# Patient Record
Sex: Male | Born: 1960 | Race: White | Hispanic: No | Marital: Married | Smoking: Former smoker
Health system: Southern US, Community
[De-identification: ages and names within clinical notes are randomized; demographics above are authoritative.]

## PROBLEM LIST (undated history)

## (undated) DIAGNOSIS — F341 Dysthymic disorder: Secondary | ICD-10-CM

## (undated) DIAGNOSIS — I10 Essential (primary) hypertension: Secondary | ICD-10-CM

## (undated) DIAGNOSIS — I519 Heart disease, unspecified: Secondary | ICD-10-CM

## (undated) DIAGNOSIS — G4700 Insomnia, unspecified: Secondary | ICD-10-CM

## (undated) DIAGNOSIS — K648 Other hemorrhoids: Secondary | ICD-10-CM

## (undated) DIAGNOSIS — E7800 Pure hypercholesterolemia, unspecified: Secondary | ICD-10-CM

## (undated) DIAGNOSIS — F17290 Nicotine dependence, other tobacco product, uncomplicated: Secondary | ICD-10-CM

## (undated) DIAGNOSIS — N529 Male erectile dysfunction, unspecified: Secondary | ICD-10-CM

## (undated) DIAGNOSIS — J4520 Mild intermittent asthma, uncomplicated: Secondary | ICD-10-CM

## (undated) DIAGNOSIS — E559 Vitamin D deficiency, unspecified: Secondary | ICD-10-CM

## (undated) HISTORY — DX: Male erectile dysfunction, unspecified: N52.9

## (undated) HISTORY — PX: NO PAST SURGERIES: SHX2092

## (undated) HISTORY — DX: Essential (primary) hypertension: I10

## (undated) HISTORY — DX: Mild intermittent asthma, uncomplicated: J45.20

## (undated) HISTORY — DX: Dysthymic disorder: F34.1

## (undated) HISTORY — DX: Other hemorrhoids: K64.8

## (undated) HISTORY — DX: Heart disease, unspecified: I51.9

## (undated) HISTORY — DX: Insomnia, unspecified: G47.00

## (undated) HISTORY — PX: OTHER SURGICAL HISTORY: SHX169

## (undated) HISTORY — DX: Vitamin D deficiency, unspecified: E55.9

## (undated) HISTORY — DX: Pure hypercholesterolemia, unspecified: E78.00

## (undated) HISTORY — DX: Nicotine dependence, other tobacco product, uncomplicated: F17.290

---

## 2004-07-11 ENCOUNTER — Emergency Department (HOSPITAL_COMMUNITY): Admission: EM | Admit: 2004-07-11 | Discharge: 2004-07-11 | Payer: Self-pay | Admitting: Emergency Medicine

## 2006-08-21 IMAGING — CT CT PELVIS W/O CM
1 of 2 series · 16 of 32 positions shown, 20 images · non-contrast
Comparison: none

CLINICAL DATA: Lt sided abdominal pain.
 CT ABDOMEN WITHOUT CONTRAST, 07/11/04, 3533 HOURS:
 Mild left hydronephrosis and perinephric stranding is seen.  Negative nephrolithiasis.  No ureteral calculi are seen in the abdomen.  
 The visualized portions of the liver, spleen, adrenal glands, pancreas, gallbladder are within normal limits.  Negative free fluid.  Negative abnormal adenopathy.

[Series 3: stone_wo 5.0 b40f st · axial · 0.73mm/px · z∈[-255,+69]mm · 16 of 92 slices shown, 20 images]
[im 7/92  soft-tissue]
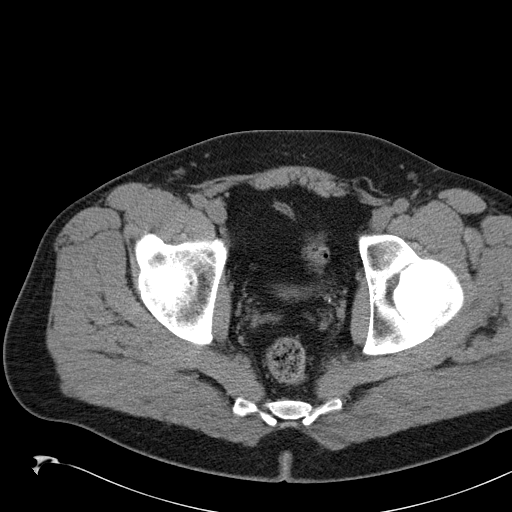
[im 7/92  bone]
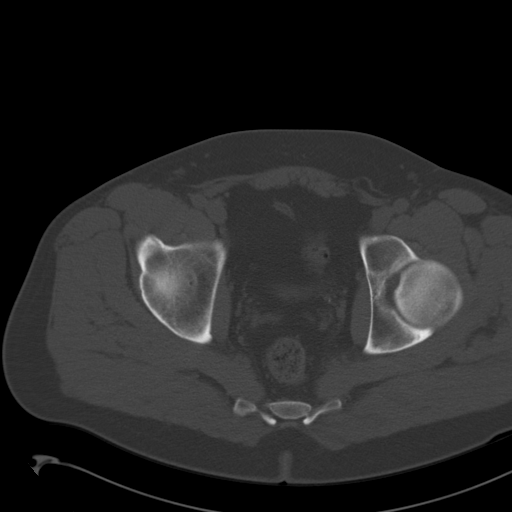
[im 13/92  soft-tissue]
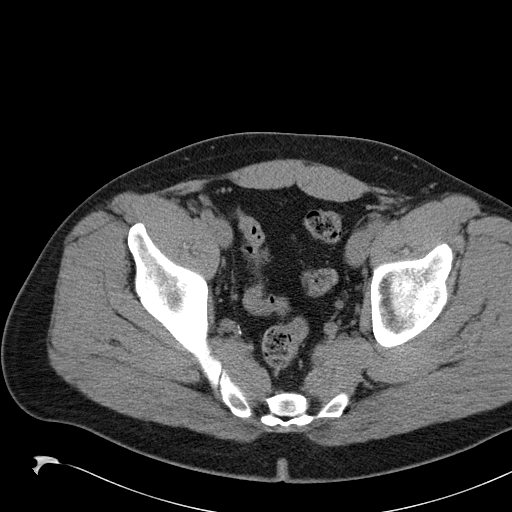
[im 19/92  soft-tissue]
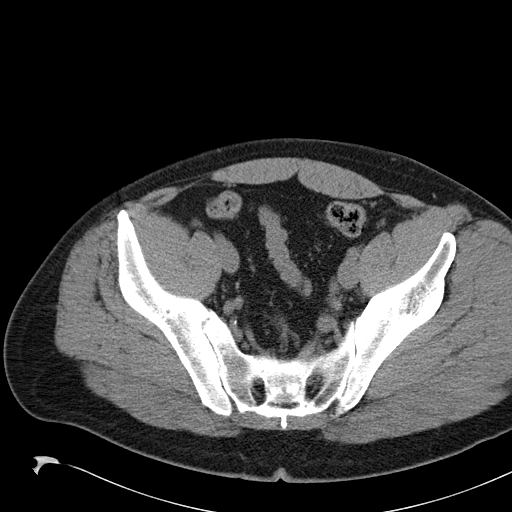
[im 25/92  soft-tissue]
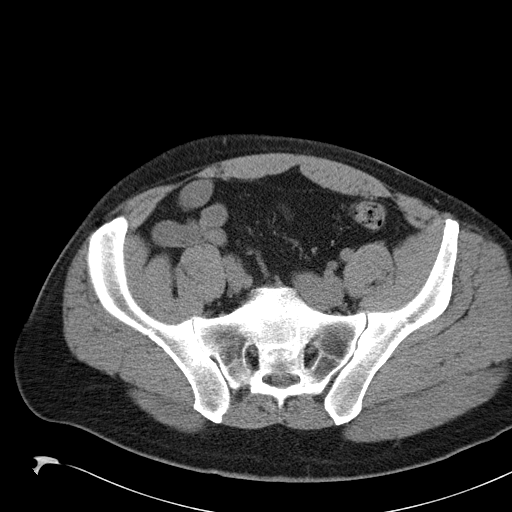
[im 31/92  soft-tissue]
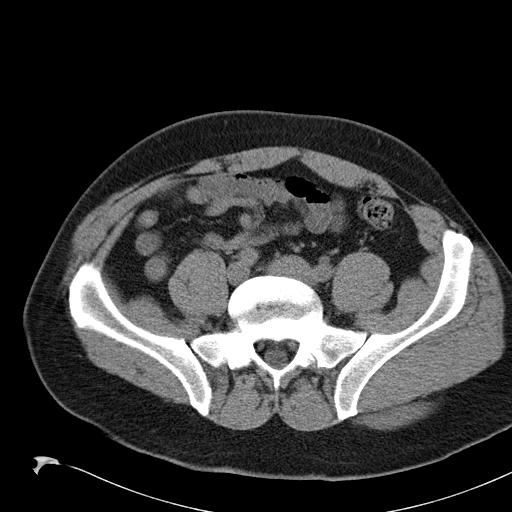
[im 37/92  soft-tissue]
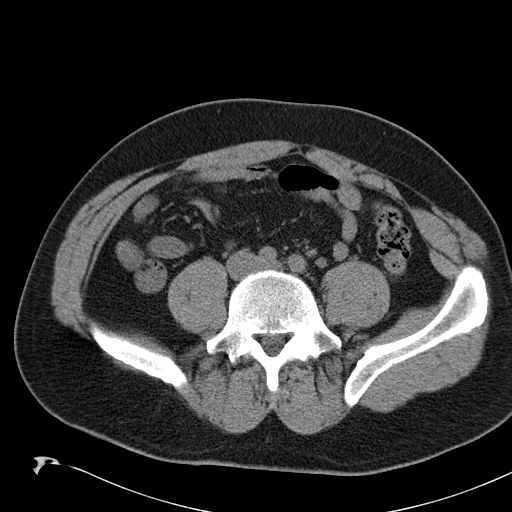
[im 43/92  soft-tissue]
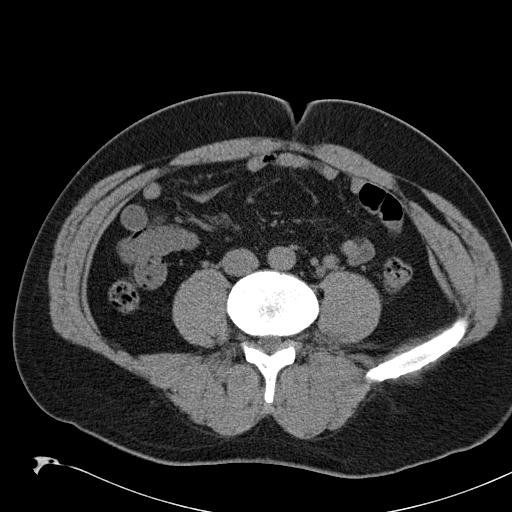
[im 49/92  soft-tissue]
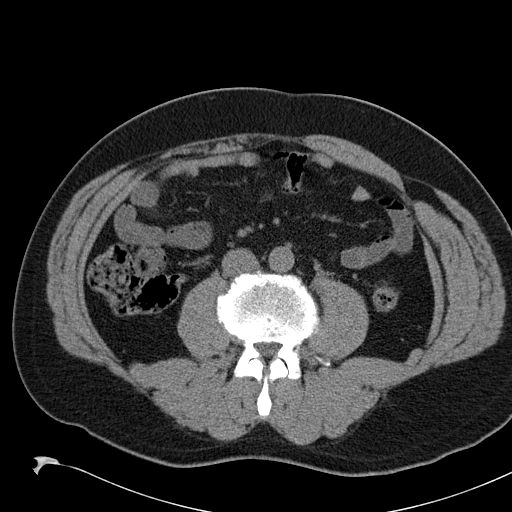
[im 55/92  soft-tissue]
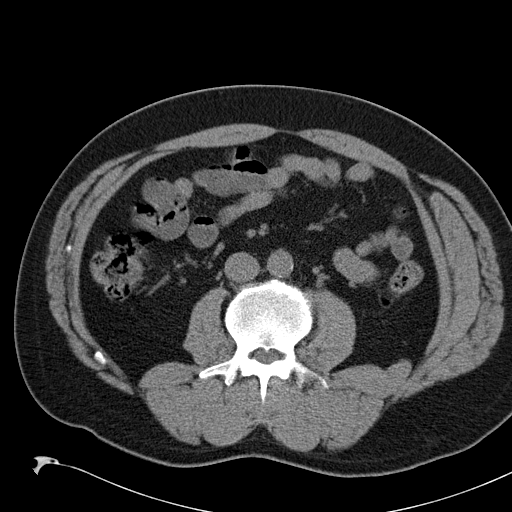
[im 55/92  bone]
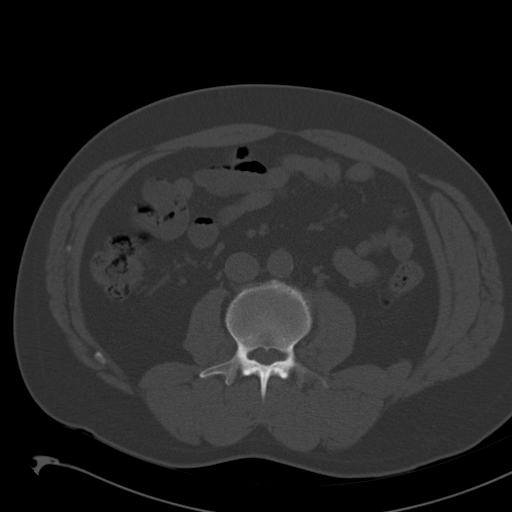
[im 61/92  soft-tissue]
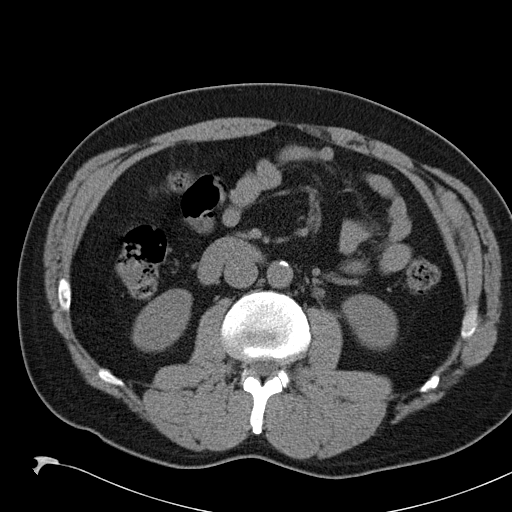
[im 67/92  soft-tissue]
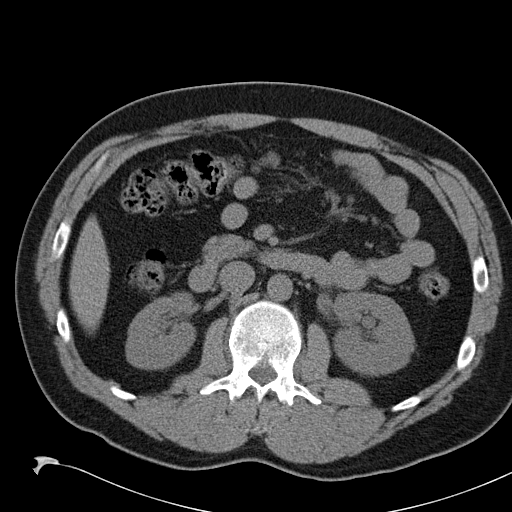
[im 73/92  soft-tissue]
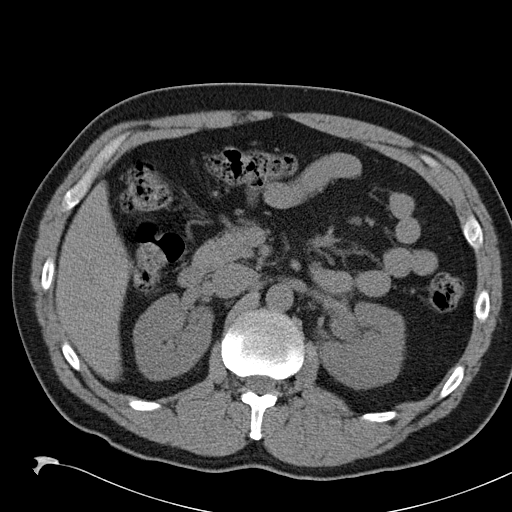
[im 79/92  soft-tissue]
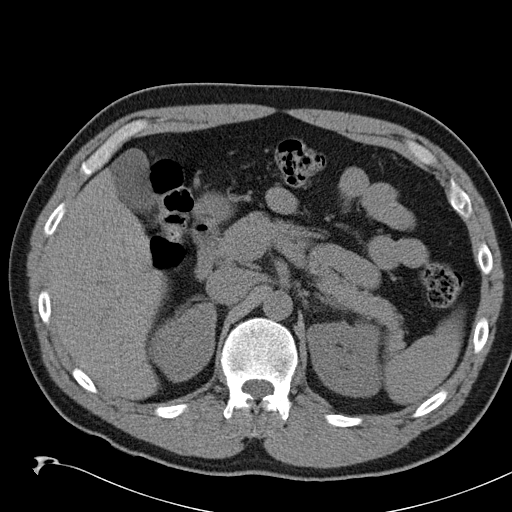
[im 79/92  lung]
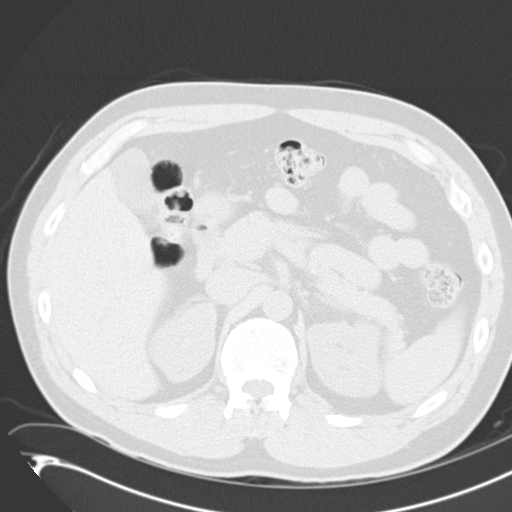
[im 82/92  lung]
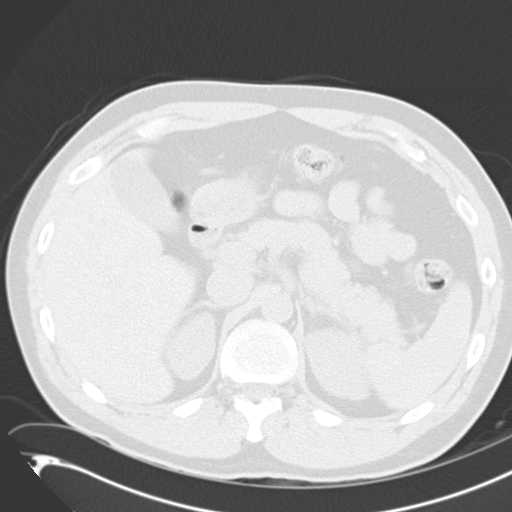
[im 85/92  soft-tissue]
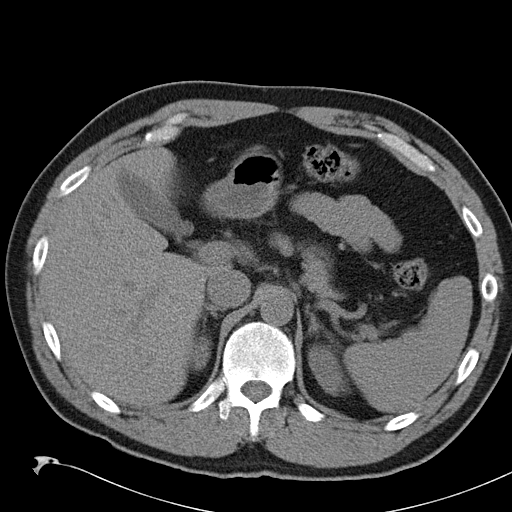
[im 85/92  lung]
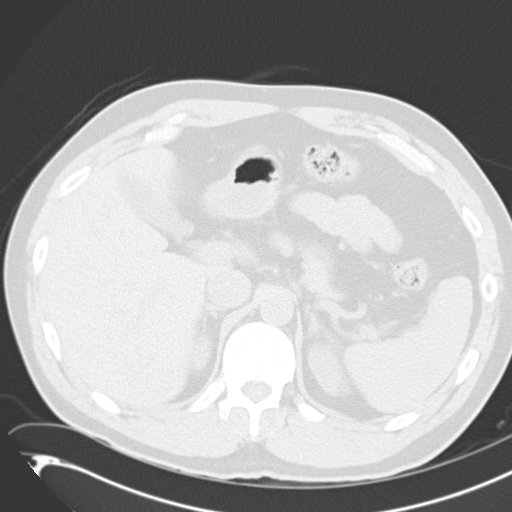
[im 88/92  lung]
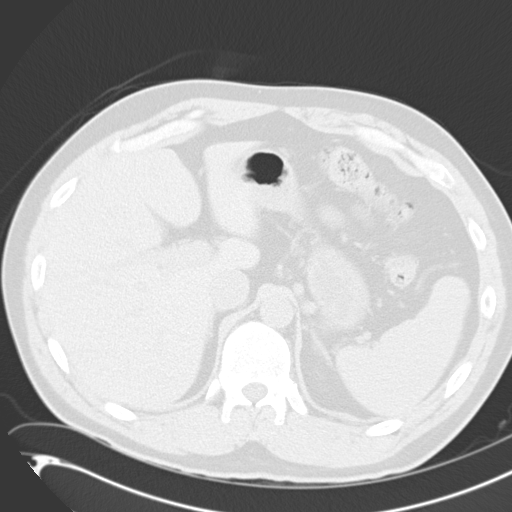

[16 of 32 positions shown; findings below may reference images not displayed]

IMPRESSION: Secondary findings of left ureteral obstruction as described above.
 CT OF THE PELVIS WITHOUT CONTRAST:
 There is a 2 mm calculus in the distal left ureter.  The bladder is decompressed.  Negative free fluid.  Negative abnormal adenopathy.
IMPRESSION: 2 mm left ureteral calculus.

## 2015-05-26 DIAGNOSIS — E559 Vitamin D deficiency, unspecified: Secondary | ICD-10-CM | POA: Diagnosis not present

## 2015-05-26 DIAGNOSIS — Z79899 Other long term (current) drug therapy: Secondary | ICD-10-CM | POA: Diagnosis not present

## 2015-05-26 DIAGNOSIS — E782 Mixed hyperlipidemia: Secondary | ICD-10-CM | POA: Diagnosis not present

## 2015-05-26 DIAGNOSIS — I10 Essential (primary) hypertension: Secondary | ICD-10-CM | POA: Diagnosis not present

## 2015-05-26 DIAGNOSIS — Z23 Encounter for immunization: Secondary | ICD-10-CM | POA: Diagnosis not present

## 2015-05-26 DIAGNOSIS — Z0001 Encounter for general adult medical examination with abnormal findings: Secondary | ICD-10-CM | POA: Diagnosis not present

## 2015-09-10 DIAGNOSIS — M542 Cervicalgia: Secondary | ICD-10-CM | POA: Diagnosis not present

## 2015-09-10 DIAGNOSIS — M546 Pain in thoracic spine: Secondary | ICD-10-CM | POA: Diagnosis not present

## 2015-09-10 DIAGNOSIS — W010XXA Fall on same level from slipping, tripping and stumbling without subsequent striking against object, initial encounter: Secondary | ICD-10-CM | POA: Diagnosis not present

## 2015-09-10 DIAGNOSIS — S299XXA Unspecified injury of thorax, initial encounter: Secondary | ICD-10-CM | POA: Diagnosis not present

## 2015-09-10 DIAGNOSIS — Y92512 Supermarket, store or market as the place of occurrence of the external cause: Secondary | ICD-10-CM | POA: Diagnosis not present

## 2015-09-22 DIAGNOSIS — M5021 Other cervical disc displacement,  high cervical region: Secondary | ICD-10-CM | POA: Diagnosis not present

## 2015-09-22 DIAGNOSIS — S13160A Subluxation of C5/C6 cervical vertebrae, initial encounter: Secondary | ICD-10-CM | POA: Diagnosis not present

## 2015-09-22 DIAGNOSIS — M542 Cervicalgia: Secondary | ICD-10-CM | POA: Diagnosis not present

## 2015-09-22 DIAGNOSIS — M4722 Other spondylosis with radiculopathy, cervical region: Secondary | ICD-10-CM | POA: Diagnosis not present

## 2015-09-28 DIAGNOSIS — M47812 Spondylosis without myelopathy or radiculopathy, cervical region: Secondary | ICD-10-CM | POA: Diagnosis not present

## 2015-09-28 DIAGNOSIS — G5622 Lesion of ulnar nerve, left upper limb: Secondary | ICD-10-CM | POA: Diagnosis not present

## 2015-09-28 DIAGNOSIS — G5620 Lesion of ulnar nerve, unspecified upper limb: Secondary | ICD-10-CM | POA: Diagnosis not present

## 2015-09-28 DIAGNOSIS — Z720 Tobacco use: Secondary | ICD-10-CM | POA: Diagnosis not present

## 2015-11-05 DIAGNOSIS — Z23 Encounter for immunization: Secondary | ICD-10-CM | POA: Diagnosis not present

## 2015-12-29 DIAGNOSIS — M4312 Spondylolisthesis, cervical region: Secondary | ICD-10-CM | POA: Diagnosis not present

## 2015-12-29 DIAGNOSIS — M4010 Other secondary kyphosis, site unspecified: Secondary | ICD-10-CM | POA: Diagnosis not present

## 2015-12-29 DIAGNOSIS — M50322 Other cervical disc degeneration at C5-C6 level: Secondary | ICD-10-CM | POA: Diagnosis not present

## 2015-12-29 DIAGNOSIS — R200 Anesthesia of skin: Secondary | ICD-10-CM | POA: Diagnosis not present

## 2016-01-03 DIAGNOSIS — G5622 Lesion of ulnar nerve, left upper limb: Secondary | ICD-10-CM | POA: Diagnosis not present

## 2016-01-03 DIAGNOSIS — M25522 Pain in left elbow: Secondary | ICD-10-CM | POA: Diagnosis not present

## 2016-01-19 DIAGNOSIS — M25522 Pain in left elbow: Secondary | ICD-10-CM | POA: Diagnosis not present

## 2016-01-19 DIAGNOSIS — R200 Anesthesia of skin: Secondary | ICD-10-CM | POA: Diagnosis not present

## 2016-01-21 DIAGNOSIS — M25522 Pain in left elbow: Secondary | ICD-10-CM | POA: Diagnosis not present

## 2016-01-27 DIAGNOSIS — M25522 Pain in left elbow: Secondary | ICD-10-CM | POA: Diagnosis not present

## 2016-01-27 DIAGNOSIS — S59902A Unspecified injury of left elbow, initial encounter: Secondary | ICD-10-CM | POA: Diagnosis not present

## 2016-02-04 DIAGNOSIS — M25522 Pain in left elbow: Secondary | ICD-10-CM | POA: Diagnosis not present

## 2016-02-24 DIAGNOSIS — M7712 Lateral epicondylitis, left elbow: Secondary | ICD-10-CM | POA: Diagnosis not present

## 2016-02-24 DIAGNOSIS — M67822 Other specified disorders of synovium, left elbow: Secondary | ICD-10-CM | POA: Diagnosis not present

## 2016-02-24 DIAGNOSIS — G5622 Lesion of ulnar nerve, left upper limb: Secondary | ICD-10-CM | POA: Diagnosis not present

## 2016-02-24 DIAGNOSIS — G8918 Other acute postprocedural pain: Secondary | ICD-10-CM | POA: Diagnosis not present

## 2016-02-24 DIAGNOSIS — I10 Essential (primary) hypertension: Secondary | ICD-10-CM | POA: Diagnosis not present

## 2016-02-24 DIAGNOSIS — E7800 Pure hypercholesterolemia, unspecified: Secondary | ICD-10-CM | POA: Diagnosis not present

## 2016-02-24 DIAGNOSIS — Z79899 Other long term (current) drug therapy: Secondary | ICD-10-CM | POA: Diagnosis not present

## 2016-02-24 DIAGNOSIS — E669 Obesity, unspecified: Secondary | ICD-10-CM | POA: Diagnosis not present

## 2016-02-24 DIAGNOSIS — Z6831 Body mass index (BMI) 31.0-31.9, adult: Secondary | ICD-10-CM | POA: Diagnosis not present

## 2016-03-09 DIAGNOSIS — R29898 Other symptoms and signs involving the musculoskeletal system: Secondary | ICD-10-CM | POA: Diagnosis not present

## 2016-03-09 DIAGNOSIS — M25522 Pain in left elbow: Secondary | ICD-10-CM | POA: Diagnosis not present

## 2016-03-09 DIAGNOSIS — M25622 Stiffness of left elbow, not elsewhere classified: Secondary | ICD-10-CM | POA: Diagnosis not present

## 2016-03-16 DIAGNOSIS — R29898 Other symptoms and signs involving the musculoskeletal system: Secondary | ICD-10-CM | POA: Diagnosis not present

## 2016-03-16 DIAGNOSIS — M25622 Stiffness of left elbow, not elsewhere classified: Secondary | ICD-10-CM | POA: Diagnosis not present

## 2016-03-16 DIAGNOSIS — M25522 Pain in left elbow: Secondary | ICD-10-CM | POA: Diagnosis not present

## 2016-03-17 DIAGNOSIS — M25522 Pain in left elbow: Secondary | ICD-10-CM | POA: Diagnosis not present

## 2016-03-23 DIAGNOSIS — R29898 Other symptoms and signs involving the musculoskeletal system: Secondary | ICD-10-CM | POA: Diagnosis not present

## 2016-03-23 DIAGNOSIS — M25522 Pain in left elbow: Secondary | ICD-10-CM | POA: Diagnosis not present

## 2016-03-23 DIAGNOSIS — M25622 Stiffness of left elbow, not elsewhere classified: Secondary | ICD-10-CM | POA: Diagnosis not present

## 2016-03-30 DIAGNOSIS — M25522 Pain in left elbow: Secondary | ICD-10-CM | POA: Diagnosis not present

## 2016-03-30 DIAGNOSIS — R29898 Other symptoms and signs involving the musculoskeletal system: Secondary | ICD-10-CM | POA: Diagnosis not present

## 2016-03-30 DIAGNOSIS — M25622 Stiffness of left elbow, not elsewhere classified: Secondary | ICD-10-CM | POA: Diagnosis not present

## 2016-04-14 DIAGNOSIS — M25522 Pain in left elbow: Secondary | ICD-10-CM | POA: Diagnosis not present

## 2016-04-14 DIAGNOSIS — M25622 Stiffness of left elbow, not elsewhere classified: Secondary | ICD-10-CM | POA: Diagnosis not present

## 2016-04-14 DIAGNOSIS — R29898 Other symptoms and signs involving the musculoskeletal system: Secondary | ICD-10-CM | POA: Diagnosis not present

## 2016-04-20 DIAGNOSIS — M25522 Pain in left elbow: Secondary | ICD-10-CM | POA: Diagnosis not present

## 2016-04-20 DIAGNOSIS — M25622 Stiffness of left elbow, not elsewhere classified: Secondary | ICD-10-CM | POA: Diagnosis not present

## 2016-04-20 DIAGNOSIS — R29898 Other symptoms and signs involving the musculoskeletal system: Secondary | ICD-10-CM | POA: Diagnosis not present

## 2016-05-03 DIAGNOSIS — R29898 Other symptoms and signs involving the musculoskeletal system: Secondary | ICD-10-CM | POA: Diagnosis not present

## 2016-05-03 DIAGNOSIS — M25622 Stiffness of left elbow, not elsewhere classified: Secondary | ICD-10-CM | POA: Diagnosis not present

## 2016-05-03 DIAGNOSIS — M25522 Pain in left elbow: Secondary | ICD-10-CM | POA: Diagnosis not present

## 2016-05-18 DIAGNOSIS — R29898 Other symptoms and signs involving the musculoskeletal system: Secondary | ICD-10-CM | POA: Diagnosis not present

## 2016-05-18 DIAGNOSIS — M25622 Stiffness of left elbow, not elsewhere classified: Secondary | ICD-10-CM | POA: Diagnosis not present

## 2016-05-18 DIAGNOSIS — M25522 Pain in left elbow: Secondary | ICD-10-CM | POA: Diagnosis not present

## 2016-06-02 DIAGNOSIS — M25522 Pain in left elbow: Secondary | ICD-10-CM | POA: Diagnosis not present

## 2016-06-02 DIAGNOSIS — M25622 Stiffness of left elbow, not elsewhere classified: Secondary | ICD-10-CM | POA: Diagnosis not present

## 2016-06-02 DIAGNOSIS — R29898 Other symptoms and signs involving the musculoskeletal system: Secondary | ICD-10-CM | POA: Diagnosis not present

## 2016-06-23 DIAGNOSIS — Z79899 Other long term (current) drug therapy: Secondary | ICD-10-CM | POA: Diagnosis not present

## 2016-06-23 DIAGNOSIS — I10 Essential (primary) hypertension: Secondary | ICD-10-CM | POA: Diagnosis not present

## 2016-06-23 DIAGNOSIS — E559 Vitamin D deficiency, unspecified: Secondary | ICD-10-CM | POA: Diagnosis not present

## 2016-06-23 DIAGNOSIS — Z125 Encounter for screening for malignant neoplasm of prostate: Secondary | ICD-10-CM | POA: Diagnosis not present

## 2016-06-23 DIAGNOSIS — E782 Mixed hyperlipidemia: Secondary | ICD-10-CM | POA: Diagnosis not present

## 2016-06-23 DIAGNOSIS — F341 Dysthymic disorder: Secondary | ICD-10-CM | POA: Diagnosis not present

## 2016-06-23 DIAGNOSIS — Z0001 Encounter for general adult medical examination with abnormal findings: Secondary | ICD-10-CM | POA: Diagnosis not present

## 2016-07-03 DIAGNOSIS — Z4789 Encounter for other orthopedic aftercare: Secondary | ICD-10-CM | POA: Diagnosis not present

## 2016-07-03 DIAGNOSIS — M25522 Pain in left elbow: Secondary | ICD-10-CM | POA: Diagnosis not present

## 2016-10-02 DIAGNOSIS — M25522 Pain in left elbow: Secondary | ICD-10-CM | POA: Diagnosis not present

## 2017-01-12 DIAGNOSIS — Z23 Encounter for immunization: Secondary | ICD-10-CM | POA: Diagnosis not present

## 2017-01-23 DIAGNOSIS — S91312A Laceration without foreign body, left foot, initial encounter: Secondary | ICD-10-CM | POA: Diagnosis not present

## 2017-02-26 DIAGNOSIS — M25522 Pain in left elbow: Secondary | ICD-10-CM | POA: Diagnosis not present

## 2017-03-29 DIAGNOSIS — M542 Cervicalgia: Secondary | ICD-10-CM | POA: Diagnosis not present

## 2017-03-29 DIAGNOSIS — G5622 Lesion of ulnar nerve, left upper limb: Secondary | ICD-10-CM | POA: Diagnosis not present

## 2017-04-04 DIAGNOSIS — M25522 Pain in left elbow: Secondary | ICD-10-CM | POA: Diagnosis not present

## 2017-07-04 DIAGNOSIS — Z125 Encounter for screening for malignant neoplasm of prostate: Secondary | ICD-10-CM | POA: Diagnosis not present

## 2017-07-04 DIAGNOSIS — Z0001 Encounter for general adult medical examination with abnormal findings: Secondary | ICD-10-CM | POA: Diagnosis not present

## 2017-07-04 DIAGNOSIS — M25522 Pain in left elbow: Secondary | ICD-10-CM | POA: Diagnosis not present

## 2017-07-04 DIAGNOSIS — Z79899 Other long term (current) drug therapy: Secondary | ICD-10-CM | POA: Diagnosis not present

## 2017-07-04 DIAGNOSIS — I10 Essential (primary) hypertension: Secondary | ICD-10-CM | POA: Diagnosis not present

## 2017-07-04 DIAGNOSIS — R311 Benign essential microscopic hematuria: Secondary | ICD-10-CM | POA: Diagnosis not present

## 2017-07-04 DIAGNOSIS — E559 Vitamin D deficiency, unspecified: Secondary | ICD-10-CM | POA: Diagnosis not present

## 2017-07-04 DIAGNOSIS — E782 Mixed hyperlipidemia: Secondary | ICD-10-CM | POA: Diagnosis not present

## 2017-09-13 DIAGNOSIS — I213 ST elevation (STEMI) myocardial infarction of unspecified site: Secondary | ICD-10-CM

## 2017-09-13 HISTORY — DX: ST elevation (STEMI) myocardial infarction of unspecified site: I21.3

## 2017-09-22 DIAGNOSIS — E7800 Pure hypercholesterolemia, unspecified: Secondary | ICD-10-CM | POA: Diagnosis not present

## 2017-09-22 DIAGNOSIS — Z720 Tobacco use: Secondary | ICD-10-CM | POA: Diagnosis not present

## 2017-09-22 DIAGNOSIS — R079 Chest pain, unspecified: Secondary | ICD-10-CM | POA: Diagnosis not present

## 2017-09-22 DIAGNOSIS — E785 Hyperlipidemia, unspecified: Secondary | ICD-10-CM | POA: Diagnosis not present

## 2017-09-22 DIAGNOSIS — I255 Ischemic cardiomyopathy: Secondary | ICD-10-CM | POA: Diagnosis not present

## 2017-09-22 DIAGNOSIS — M47812 Spondylosis without myelopathy or radiculopathy, cervical region: Secondary | ICD-10-CM | POA: Diagnosis not present

## 2017-09-22 DIAGNOSIS — I5189 Other ill-defined heart diseases: Secondary | ICD-10-CM | POA: Diagnosis not present

## 2017-09-22 DIAGNOSIS — I089 Rheumatic multiple valve disease, unspecified: Secondary | ICD-10-CM | POA: Diagnosis not present

## 2017-09-22 DIAGNOSIS — R0989 Other specified symptoms and signs involving the circulatory and respiratory systems: Secondary | ICD-10-CM | POA: Diagnosis not present

## 2017-09-22 DIAGNOSIS — I2510 Atherosclerotic heart disease of native coronary artery without angina pectoris: Secondary | ICD-10-CM | POA: Diagnosis not present

## 2017-09-22 DIAGNOSIS — I10 Essential (primary) hypertension: Secondary | ICD-10-CM | POA: Diagnosis not present

## 2017-09-22 DIAGNOSIS — I213 ST elevation (STEMI) myocardial infarction of unspecified site: Secondary | ICD-10-CM | POA: Diagnosis not present

## 2017-09-22 DIAGNOSIS — F1010 Alcohol abuse, uncomplicated: Secondary | ICD-10-CM | POA: Diagnosis not present

## 2017-09-22 DIAGNOSIS — I517 Cardiomegaly: Secondary | ICD-10-CM | POA: Diagnosis not present

## 2017-09-22 DIAGNOSIS — I2109 ST elevation (STEMI) myocardial infarction involving other coronary artery of anterior wall: Secondary | ICD-10-CM | POA: Diagnosis not present

## 2017-09-23 DIAGNOSIS — I517 Cardiomegaly: Secondary | ICD-10-CM | POA: Diagnosis not present

## 2017-09-23 DIAGNOSIS — I213 ST elevation (STEMI) myocardial infarction of unspecified site: Secondary | ICD-10-CM | POA: Diagnosis not present

## 2017-09-23 DIAGNOSIS — I5189 Other ill-defined heart diseases: Secondary | ICD-10-CM | POA: Diagnosis not present

## 2017-09-23 DIAGNOSIS — I089 Rheumatic multiple valve disease, unspecified: Secondary | ICD-10-CM | POA: Diagnosis not present

## 2017-09-24 DIAGNOSIS — I2510 Atherosclerotic heart disease of native coronary artery without angina pectoris: Secondary | ICD-10-CM | POA: Diagnosis not present

## 2017-09-25 DIAGNOSIS — I2510 Atherosclerotic heart disease of native coronary artery without angina pectoris: Secondary | ICD-10-CM | POA: Diagnosis not present

## 2017-10-02 DIAGNOSIS — I2510 Atherosclerotic heart disease of native coronary artery without angina pectoris: Secondary | ICD-10-CM | POA: Diagnosis not present

## 2017-10-02 DIAGNOSIS — I10 Essential (primary) hypertension: Secondary | ICD-10-CM | POA: Diagnosis not present

## 2017-10-23 DIAGNOSIS — Z955 Presence of coronary angioplasty implant and graft: Secondary | ICD-10-CM | POA: Diagnosis not present

## 2017-10-23 DIAGNOSIS — Z48812 Encounter for surgical aftercare following surgery on the circulatory system: Secondary | ICD-10-CM | POA: Diagnosis not present

## 2017-10-24 DIAGNOSIS — Z955 Presence of coronary angioplasty implant and graft: Secondary | ICD-10-CM | POA: Diagnosis not present

## 2017-10-24 DIAGNOSIS — Z48812 Encounter for surgical aftercare following surgery on the circulatory system: Secondary | ICD-10-CM | POA: Diagnosis not present

## 2017-10-26 DIAGNOSIS — Z48812 Encounter for surgical aftercare following surgery on the circulatory system: Secondary | ICD-10-CM | POA: Diagnosis not present

## 2017-10-26 DIAGNOSIS — Z955 Presence of coronary angioplasty implant and graft: Secondary | ICD-10-CM | POA: Diagnosis not present

## 2017-10-30 DIAGNOSIS — Z955 Presence of coronary angioplasty implant and graft: Secondary | ICD-10-CM | POA: Diagnosis not present

## 2017-10-30 DIAGNOSIS — Z48812 Encounter for surgical aftercare following surgery on the circulatory system: Secondary | ICD-10-CM | POA: Diagnosis not present

## 2017-11-01 DIAGNOSIS — Z48812 Encounter for surgical aftercare following surgery on the circulatory system: Secondary | ICD-10-CM | POA: Diagnosis not present

## 2017-11-01 DIAGNOSIS — Z955 Presence of coronary angioplasty implant and graft: Secondary | ICD-10-CM | POA: Diagnosis not present

## 2017-11-02 DIAGNOSIS — Z955 Presence of coronary angioplasty implant and graft: Secondary | ICD-10-CM | POA: Diagnosis not present

## 2017-11-02 DIAGNOSIS — Z48812 Encounter for surgical aftercare following surgery on the circulatory system: Secondary | ICD-10-CM | POA: Diagnosis not present

## 2017-11-06 DIAGNOSIS — Z955 Presence of coronary angioplasty implant and graft: Secondary | ICD-10-CM | POA: Diagnosis not present

## 2017-11-06 DIAGNOSIS — Z48812 Encounter for surgical aftercare following surgery on the circulatory system: Secondary | ICD-10-CM | POA: Diagnosis not present

## 2017-11-07 DIAGNOSIS — I2109 ST elevation (STEMI) myocardial infarction involving other coronary artery of anterior wall: Secondary | ICD-10-CM | POA: Diagnosis not present

## 2017-11-07 DIAGNOSIS — I2510 Atherosclerotic heart disease of native coronary artery without angina pectoris: Secondary | ICD-10-CM | POA: Diagnosis not present

## 2017-11-07 DIAGNOSIS — I10 Essential (primary) hypertension: Secondary | ICD-10-CM | POA: Diagnosis not present

## 2017-11-07 DIAGNOSIS — Z955 Presence of coronary angioplasty implant and graft: Secondary | ICD-10-CM | POA: Diagnosis not present

## 2017-11-07 DIAGNOSIS — Z48812 Encounter for surgical aftercare following surgery on the circulatory system: Secondary | ICD-10-CM | POA: Diagnosis not present

## 2017-11-09 DIAGNOSIS — Z48812 Encounter for surgical aftercare following surgery on the circulatory system: Secondary | ICD-10-CM | POA: Diagnosis not present

## 2017-11-09 DIAGNOSIS — Z955 Presence of coronary angioplasty implant and graft: Secondary | ICD-10-CM | POA: Diagnosis not present

## 2017-11-13 DIAGNOSIS — Z955 Presence of coronary angioplasty implant and graft: Secondary | ICD-10-CM | POA: Diagnosis not present

## 2017-11-13 DIAGNOSIS — Z48812 Encounter for surgical aftercare following surgery on the circulatory system: Secondary | ICD-10-CM | POA: Diagnosis not present

## 2017-11-16 DIAGNOSIS — Z48812 Encounter for surgical aftercare following surgery on the circulatory system: Secondary | ICD-10-CM | POA: Diagnosis not present

## 2017-11-16 DIAGNOSIS — Z955 Presence of coronary angioplasty implant and graft: Secondary | ICD-10-CM | POA: Diagnosis not present

## 2017-11-20 DIAGNOSIS — Z48812 Encounter for surgical aftercare following surgery on the circulatory system: Secondary | ICD-10-CM | POA: Diagnosis not present

## 2017-11-20 DIAGNOSIS — Z955 Presence of coronary angioplasty implant and graft: Secondary | ICD-10-CM | POA: Diagnosis not present

## 2017-11-21 DIAGNOSIS — Z48812 Encounter for surgical aftercare following surgery on the circulatory system: Secondary | ICD-10-CM | POA: Diagnosis not present

## 2017-11-21 DIAGNOSIS — Z955 Presence of coronary angioplasty implant and graft: Secondary | ICD-10-CM | POA: Diagnosis not present

## 2017-11-22 DIAGNOSIS — Z955 Presence of coronary angioplasty implant and graft: Secondary | ICD-10-CM | POA: Diagnosis not present

## 2017-11-22 DIAGNOSIS — Z48812 Encounter for surgical aftercare following surgery on the circulatory system: Secondary | ICD-10-CM | POA: Diagnosis not present

## 2017-11-27 DIAGNOSIS — Z955 Presence of coronary angioplasty implant and graft: Secondary | ICD-10-CM | POA: Diagnosis not present

## 2017-11-27 DIAGNOSIS — Z48812 Encounter for surgical aftercare following surgery on the circulatory system: Secondary | ICD-10-CM | POA: Diagnosis not present

## 2017-11-28 DIAGNOSIS — Z48812 Encounter for surgical aftercare following surgery on the circulatory system: Secondary | ICD-10-CM | POA: Diagnosis not present

## 2017-11-28 DIAGNOSIS — Z955 Presence of coronary angioplasty implant and graft: Secondary | ICD-10-CM | POA: Diagnosis not present

## 2017-11-30 DIAGNOSIS — Z48812 Encounter for surgical aftercare following surgery on the circulatory system: Secondary | ICD-10-CM | POA: Diagnosis not present

## 2017-11-30 DIAGNOSIS — Z955 Presence of coronary angioplasty implant and graft: Secondary | ICD-10-CM | POA: Diagnosis not present

## 2017-12-04 DIAGNOSIS — Z48812 Encounter for surgical aftercare following surgery on the circulatory system: Secondary | ICD-10-CM | POA: Diagnosis not present

## 2017-12-04 DIAGNOSIS — Z955 Presence of coronary angioplasty implant and graft: Secondary | ICD-10-CM | POA: Diagnosis not present

## 2017-12-05 DIAGNOSIS — Z955 Presence of coronary angioplasty implant and graft: Secondary | ICD-10-CM | POA: Diagnosis not present

## 2017-12-05 DIAGNOSIS — Z48812 Encounter for surgical aftercare following surgery on the circulatory system: Secondary | ICD-10-CM | POA: Diagnosis not present

## 2017-12-07 DIAGNOSIS — Z955 Presence of coronary angioplasty implant and graft: Secondary | ICD-10-CM | POA: Diagnosis not present

## 2017-12-07 DIAGNOSIS — Z48812 Encounter for surgical aftercare following surgery on the circulatory system: Secondary | ICD-10-CM | POA: Diagnosis not present

## 2017-12-11 DIAGNOSIS — Z48812 Encounter for surgical aftercare following surgery on the circulatory system: Secondary | ICD-10-CM | POA: Diagnosis not present

## 2017-12-11 DIAGNOSIS — Z955 Presence of coronary angioplasty implant and graft: Secondary | ICD-10-CM | POA: Diagnosis not present

## 2017-12-12 DIAGNOSIS — Z48812 Encounter for surgical aftercare following surgery on the circulatory system: Secondary | ICD-10-CM | POA: Diagnosis not present

## 2017-12-12 DIAGNOSIS — Z955 Presence of coronary angioplasty implant and graft: Secondary | ICD-10-CM | POA: Diagnosis not present

## 2017-12-14 DIAGNOSIS — Z955 Presence of coronary angioplasty implant and graft: Secondary | ICD-10-CM | POA: Diagnosis not present

## 2017-12-14 DIAGNOSIS — Z48812 Encounter for surgical aftercare following surgery on the circulatory system: Secondary | ICD-10-CM | POA: Diagnosis not present

## 2017-12-18 DIAGNOSIS — Z955 Presence of coronary angioplasty implant and graft: Secondary | ICD-10-CM | POA: Diagnosis not present

## 2017-12-18 DIAGNOSIS — Z48812 Encounter for surgical aftercare following surgery on the circulatory system: Secondary | ICD-10-CM | POA: Diagnosis not present

## 2017-12-19 DIAGNOSIS — Z955 Presence of coronary angioplasty implant and graft: Secondary | ICD-10-CM | POA: Diagnosis not present

## 2017-12-19 DIAGNOSIS — Z48812 Encounter for surgical aftercare following surgery on the circulatory system: Secondary | ICD-10-CM | POA: Diagnosis not present

## 2017-12-21 DIAGNOSIS — Z48812 Encounter for surgical aftercare following surgery on the circulatory system: Secondary | ICD-10-CM | POA: Diagnosis not present

## 2017-12-21 DIAGNOSIS — Z955 Presence of coronary angioplasty implant and graft: Secondary | ICD-10-CM | POA: Diagnosis not present

## 2017-12-25 DIAGNOSIS — Z48812 Encounter for surgical aftercare following surgery on the circulatory system: Secondary | ICD-10-CM | POA: Diagnosis not present

## 2017-12-25 DIAGNOSIS — Z955 Presence of coronary angioplasty implant and graft: Secondary | ICD-10-CM | POA: Diagnosis not present

## 2017-12-28 DIAGNOSIS — Z955 Presence of coronary angioplasty implant and graft: Secondary | ICD-10-CM | POA: Diagnosis not present

## 2017-12-28 DIAGNOSIS — Z48812 Encounter for surgical aftercare following surgery on the circulatory system: Secondary | ICD-10-CM | POA: Diagnosis not present

## 2018-01-02 DIAGNOSIS — Z955 Presence of coronary angioplasty implant and graft: Secondary | ICD-10-CM | POA: Diagnosis not present

## 2018-01-02 DIAGNOSIS — Z48812 Encounter for surgical aftercare following surgery on the circulatory system: Secondary | ICD-10-CM | POA: Diagnosis not present

## 2018-01-28 DIAGNOSIS — M9902 Segmental and somatic dysfunction of thoracic region: Secondary | ICD-10-CM | POA: Diagnosis not present

## 2018-01-28 DIAGNOSIS — S335XXA Sprain of ligaments of lumbar spine, initial encounter: Secondary | ICD-10-CM | POA: Diagnosis not present

## 2018-01-28 DIAGNOSIS — M9901 Segmental and somatic dysfunction of cervical region: Secondary | ICD-10-CM | POA: Diagnosis not present

## 2018-01-28 DIAGNOSIS — M9903 Segmental and somatic dysfunction of lumbar region: Secondary | ICD-10-CM | POA: Diagnosis not present

## 2018-01-29 DIAGNOSIS — M9901 Segmental and somatic dysfunction of cervical region: Secondary | ICD-10-CM | POA: Diagnosis not present

## 2018-01-29 DIAGNOSIS — M9902 Segmental and somatic dysfunction of thoracic region: Secondary | ICD-10-CM | POA: Diagnosis not present

## 2018-01-29 DIAGNOSIS — M9903 Segmental and somatic dysfunction of lumbar region: Secondary | ICD-10-CM | POA: Diagnosis not present

## 2018-01-29 DIAGNOSIS — S335XXA Sprain of ligaments of lumbar spine, initial encounter: Secondary | ICD-10-CM | POA: Diagnosis not present

## 2018-01-30 DIAGNOSIS — M9902 Segmental and somatic dysfunction of thoracic region: Secondary | ICD-10-CM | POA: Diagnosis not present

## 2018-01-30 DIAGNOSIS — M9901 Segmental and somatic dysfunction of cervical region: Secondary | ICD-10-CM | POA: Diagnosis not present

## 2018-01-30 DIAGNOSIS — M9903 Segmental and somatic dysfunction of lumbar region: Secondary | ICD-10-CM | POA: Diagnosis not present

## 2018-01-30 DIAGNOSIS — S335XXA Sprain of ligaments of lumbar spine, initial encounter: Secondary | ICD-10-CM | POA: Diagnosis not present

## 2018-01-31 DIAGNOSIS — S335XXA Sprain of ligaments of lumbar spine, initial encounter: Secondary | ICD-10-CM | POA: Diagnosis not present

## 2018-01-31 DIAGNOSIS — M9902 Segmental and somatic dysfunction of thoracic region: Secondary | ICD-10-CM | POA: Diagnosis not present

## 2018-01-31 DIAGNOSIS — M9901 Segmental and somatic dysfunction of cervical region: Secondary | ICD-10-CM | POA: Diagnosis not present

## 2018-01-31 DIAGNOSIS — M9903 Segmental and somatic dysfunction of lumbar region: Secondary | ICD-10-CM | POA: Diagnosis not present

## 2018-02-04 DIAGNOSIS — S335XXA Sprain of ligaments of lumbar spine, initial encounter: Secondary | ICD-10-CM | POA: Diagnosis not present

## 2018-02-04 DIAGNOSIS — M9903 Segmental and somatic dysfunction of lumbar region: Secondary | ICD-10-CM | POA: Diagnosis not present

## 2018-02-04 DIAGNOSIS — M9902 Segmental and somatic dysfunction of thoracic region: Secondary | ICD-10-CM | POA: Diagnosis not present

## 2018-02-04 DIAGNOSIS — M9901 Segmental and somatic dysfunction of cervical region: Secondary | ICD-10-CM | POA: Diagnosis not present

## 2018-02-05 DIAGNOSIS — I252 Old myocardial infarction: Secondary | ICD-10-CM | POA: Diagnosis not present

## 2018-02-05 DIAGNOSIS — I2510 Atherosclerotic heart disease of native coronary artery without angina pectoris: Secondary | ICD-10-CM | POA: Diagnosis not present

## 2018-02-05 DIAGNOSIS — I10 Essential (primary) hypertension: Secondary | ICD-10-CM | POA: Diagnosis not present

## 2018-02-05 DIAGNOSIS — E785 Hyperlipidemia, unspecified: Secondary | ICD-10-CM | POA: Diagnosis not present

## 2018-02-11 DIAGNOSIS — M9903 Segmental and somatic dysfunction of lumbar region: Secondary | ICD-10-CM | POA: Diagnosis not present

## 2018-02-11 DIAGNOSIS — M9902 Segmental and somatic dysfunction of thoracic region: Secondary | ICD-10-CM | POA: Diagnosis not present

## 2018-02-11 DIAGNOSIS — S335XXA Sprain of ligaments of lumbar spine, initial encounter: Secondary | ICD-10-CM | POA: Diagnosis not present

## 2018-02-11 DIAGNOSIS — M9901 Segmental and somatic dysfunction of cervical region: Secondary | ICD-10-CM | POA: Diagnosis not present

## 2018-04-04 DIAGNOSIS — E669 Obesity, unspecified: Secondary | ICD-10-CM | POA: Diagnosis not present

## 2018-04-04 DIAGNOSIS — I2510 Atherosclerotic heart disease of native coronary artery without angina pectoris: Secondary | ICD-10-CM | POA: Diagnosis not present

## 2018-04-04 DIAGNOSIS — I10 Essential (primary) hypertension: Secondary | ICD-10-CM | POA: Diagnosis not present

## 2018-04-04 DIAGNOSIS — E781 Pure hyperglyceridemia: Secondary | ICD-10-CM | POA: Diagnosis not present

## 2018-04-04 DIAGNOSIS — F341 Dysthymic disorder: Secondary | ICD-10-CM | POA: Diagnosis not present

## 2018-04-15 DIAGNOSIS — R0681 Apnea, not elsewhere classified: Secondary | ICD-10-CM | POA: Diagnosis not present

## 2018-07-22 DIAGNOSIS — R0681 Apnea, not elsewhere classified: Secondary | ICD-10-CM | POA: Diagnosis not present

## 2018-07-22 DIAGNOSIS — I10 Essential (primary) hypertension: Secondary | ICD-10-CM | POA: Diagnosis not present

## 2018-07-22 DIAGNOSIS — I2510 Atherosclerotic heart disease of native coronary artery without angina pectoris: Secondary | ICD-10-CM | POA: Diagnosis not present

## 2018-07-22 DIAGNOSIS — F341 Dysthymic disorder: Secondary | ICD-10-CM | POA: Diagnosis not present

## 2018-07-31 DIAGNOSIS — E559 Vitamin D deficiency, unspecified: Secondary | ICD-10-CM | POA: Diagnosis not present

## 2018-07-31 DIAGNOSIS — Z125 Encounter for screening for malignant neoplasm of prostate: Secondary | ICD-10-CM | POA: Diagnosis not present

## 2018-07-31 DIAGNOSIS — I2510 Atherosclerotic heart disease of native coronary artery without angina pectoris: Secondary | ICD-10-CM | POA: Diagnosis not present

## 2018-08-02 DIAGNOSIS — B349 Viral infection, unspecified: Secondary | ICD-10-CM | POA: Diagnosis not present

## 2018-10-02 DIAGNOSIS — E781 Pure hyperglyceridemia: Secondary | ICD-10-CM | POA: Diagnosis not present

## 2018-10-18 DIAGNOSIS — I252 Old myocardial infarction: Secondary | ICD-10-CM | POA: Diagnosis not present

## 2018-10-18 DIAGNOSIS — E785 Hyperlipidemia, unspecified: Secondary | ICD-10-CM | POA: Diagnosis not present

## 2018-10-18 DIAGNOSIS — I2510 Atherosclerotic heart disease of native coronary artery without angina pectoris: Secondary | ICD-10-CM | POA: Diagnosis not present

## 2018-10-18 DIAGNOSIS — I10 Essential (primary) hypertension: Secondary | ICD-10-CM | POA: Diagnosis not present

## 2018-11-12 DIAGNOSIS — K6289 Other specified diseases of anus and rectum: Secondary | ICD-10-CM | POA: Diagnosis not present

## 2019-01-20 DIAGNOSIS — Z125 Encounter for screening for malignant neoplasm of prostate: Secondary | ICD-10-CM | POA: Diagnosis not present

## 2019-01-20 DIAGNOSIS — E7800 Pure hypercholesterolemia, unspecified: Secondary | ICD-10-CM | POA: Diagnosis not present

## 2019-01-20 DIAGNOSIS — E559 Vitamin D deficiency, unspecified: Secondary | ICD-10-CM | POA: Diagnosis not present

## 2019-01-20 DIAGNOSIS — Z79899 Other long term (current) drug therapy: Secondary | ICD-10-CM | POA: Diagnosis not present

## 2019-01-23 DIAGNOSIS — E559 Vitamin D deficiency, unspecified: Secondary | ICD-10-CM | POA: Diagnosis not present

## 2019-01-23 DIAGNOSIS — E7800 Pure hypercholesterolemia, unspecified: Secondary | ICD-10-CM | POA: Diagnosis not present

## 2019-01-23 DIAGNOSIS — I2510 Atherosclerotic heart disease of native coronary artery without angina pectoris: Secondary | ICD-10-CM | POA: Diagnosis not present

## 2019-01-23 DIAGNOSIS — I10 Essential (primary) hypertension: Secondary | ICD-10-CM | POA: Diagnosis not present

## 2019-01-23 DIAGNOSIS — Z0000 Encounter for general adult medical examination without abnormal findings: Secondary | ICD-10-CM | POA: Diagnosis not present

## 2019-04-06 DIAGNOSIS — Z23 Encounter for immunization: Secondary | ICD-10-CM | POA: Diagnosis not present

## 2019-06-24 ENCOUNTER — Ambulatory Visit (INDEPENDENT_AMBULATORY_CARE_PROVIDER_SITE_OTHER): Payer: BC Managed Care – PPO | Admitting: Neurology

## 2019-06-24 ENCOUNTER — Other Ambulatory Visit: Payer: Self-pay

## 2019-06-24 ENCOUNTER — Encounter: Payer: Self-pay | Admitting: *Deleted

## 2019-06-24 ENCOUNTER — Encounter: Payer: Self-pay | Admitting: Neurology

## 2019-06-24 VITALS — BP 150/97 | HR 60 | Temp 97.2°F | Ht 78.0 in | Wt 288.0 lb

## 2019-06-24 DIAGNOSIS — G629 Polyneuropathy, unspecified: Secondary | ICD-10-CM | POA: Insufficient documentation

## 2019-06-24 DIAGNOSIS — F1010 Alcohol abuse, uncomplicated: Secondary | ICD-10-CM

## 2019-06-24 MED ORDER — GABAPENTIN 300 MG PO CAPS: 300.0000 mg | ORAL_CAPSULE | Freq: Every day | ORAL | 6 refills | Status: AC

## 2019-06-24 NOTE — Progress Notes (Signed)
Source: notes from referring provider's office.

## 2019-06-24 NOTE — Patient Instructions (Addendum)
EMG/NCS Blood work today   Peripheral Neuropathy Peripheral neuropathy is a type of nerve damage. It affects nerves that carry signals between the spinal cord and the arms, legs, and the rest of the body (peripheral nerves). It does not affect nerves in the spinal cord or brain. In peripheral neuropathy, one nerve or a group of nerves may be damaged. Peripheral neuropathy is a broad category that includes many specific nerve disorders, like diabetic neuropathy, hereditary neuropathy, and carpal tunnel syndrome. What are the causes? This condition may be caused by:  Diabetes. This is the most common cause of peripheral neuropathy.  Nerve injury.  Pressure or stress on a nerve that lasts a long time.  Lack (deficiency) of B vitamins. This can result from alcoholism, poor diet, or a restricted diet.  Infections.  Autoimmune diseases, such as rheumatoid arthritis and systemic lupus erythematosus.  Nerve diseases that are passed from parent to child (inherited).  Some medicines, such as cancer medicines (chemotherapy).  Poisonous (toxic) substances, such as lead and mercury.  Too little blood flowing to the legs.  Kidney disease.  Thyroid disease. In some cases, the cause of this condition is not known. What are the signs or symptoms? Symptoms of this condition depend on which of your nerves is damaged. Common symptoms include:  Loss of feeling (numbness) in the feet, hands, or both.  Tingling in the feet, hands, or both.  Burning pain.  Very sensitive skin.  Weakness.  Not being able to move a part of the body (paralysis).  Muscle twitching.  Clumsiness or poor coordination.  Loss of balance.  Not being able to control your bladder.  Feeling dizzy.  Sexual problems. How is this diagnosed? Diagnosing and finding the cause of peripheral neuropathy can be difficult. Your health care provider will take your medical history and do a physical exam. A neurological  exam will also be done. This involves checking things that are affected by your brain, spinal cord, and nerves (nervous system). For example, your health care provider will check your reflexes, how you move, and what you can feel. You may have other tests, such as:  Blood tests.  Electromyogram (EMG) and nerve conduction tests. These tests check nerve function and how well the nerves are controlling the muscles.  Imaging tests, such as CT scans or MRI to rule out other causes of your symptoms.  Removing a small piece of nerve to be examined in a lab (nerve biopsy). This is rare.  Removing and examining a small amount of the fluid that surrounds the brain and spinal cord (lumbar puncture). This is rare. How is this treated? Treatment for this condition may involve:  Treating the underlying cause of the neuropathy, such as diabetes, kidney disease, or vitamin deficiencies.  Stopping medicines that can cause neuropathy, such as chemotherapy.  Medicine to relieve pain. Medicines may include: ? Prescription or over-the-counter pain medicine. ? Antiseizure medicine. ? Antidepressants. ? Pain-relieving patches that are applied to painful areas of skin.  Surgery to relieve pressure on a nerve or to destroy a nerve that is causing pain.  Physical therapy to help improve movement and balance.  Devices to help you move around (assistive devices). Follow these instructions at home: Medicines  Take over-the-counter and prescription medicines only as told by your health care provider. Do not take any other medicines without first asking your health care provider.  Do not drive or use heavy machinery while taking prescription pain medicine. Lifestyle   Do not  use any products that contain nicotine or tobacco, such as cigarettes and e-cigarettes. Smoking keeps blood from reaching damaged nerves. If you need help quitting, ask your health care provider.  Avoid or limit alcohol. Too much  alcohol can cause a vitamin B deficiency, and vitamin B is needed for healthy nerves.  Eat a healthy diet. This includes: ? Eating foods that are high in fiber, such as fresh fruits and vegetables, whole grains, and beans. ? Limiting foods that are high in fat and processed sugars, such as fried or sweet foods. General instructions   If you have diabetes, work closely with your health care provider to keep your blood sugar under control.  If you have numbness in your feet: ? Check every day for signs of injury or infection. Watch for redness, warmth, and swelling. ? Wear padded socks and comfortable shoes. These help protect your feet.  Develop a good support system. Living with peripheral neuropathy can be stressful. Consider talking with a mental health specialist or joining a support group.  Use assistive devices and attend physical therapy as told by your health care provider. This may include using a walker or a cane.  Keep all follow-up visits as told by your health care provider. This is important. Contact a health care provider if:  You have new signs or symptoms of peripheral neuropathy.  You are struggling emotionally from dealing with peripheral neuropathy.  Your pain is not well-controlled. Get help right away if:  You have an injury or infection that is not healing normally.  You develop new weakness in an arm or leg.  You fall frequently. Summary  Peripheral neuropathy is when the nerves in the arms, or legs are damaged, resulting in numbness, weakness, or pain.  There are many causes of peripheral neuropathy, including diabetes, pinched nerves, vitamin deficiencies, autoimmune disease, and hereditary conditions.  Diagnosing and finding the cause of peripheral neuropathy can be difficult. Your health care provider will take your medical history, do a physical exam, and do tests, including blood tests and nerve function tests.  Treatment involves treating the  underlying cause of the neuropathy and taking medicines to help control pain. Physical therapy and assistive devices may also help. This information is not intended to replace advice given to you by your health care provider. Make sure you discuss any questions you have with your health care provider. Document Revised: 01/12/2017 Document Reviewed: 04/10/2016 Elsevier Patient Education  2020 [employer]   Electromyoneurogram Electromyoneurogram is a test to check how well your muscles and nerves are working. This procedure includes the combined use of electromyogram (EMG) and nerve conduction study (NCS). EMG is used to look for muscular disorders. NCS, which is also called electroneurogram, measures how well your nerves are controlling your muscles. The procedures are usually done together to check if your muscles and nerves are healthy. If the results of the tests are abnormal, this may indicate disease or injury, such as a neuromuscular disease or peripheral nerve damage. Tell a health care provider about:  Any allergies you have.  All medicines you are taking, including vitamins, herbs, eye drops, creams, and over-the-counter medicines.  Any problems you or family members have had with anesthetic medicines.  Any blood disorders you have.  Any surgeries you have had.  Any medical conditions you have.  If you have a pacemaker.  Whether you are pregnant or may be pregnant. What are the risks? Generally, this is a safe procedure. However, problems may occur,  including:  Infection where the electrodes were inserted.  Bleeding. What happens before the procedure? Medicines Ask your health care provider about:  Changing or stopping your regular medicines. This is especially important if you are taking diabetes medicines or blood thinners.  Taking medicines such as aspirin and ibuprofen. These medicines can thin your blood. Do not take these medicines unless your health care  provider tells you to take them.  Taking over-the-counter medicines, vitamins, herbs, and supplements. General instructions  Your health care provider may ask you to avoid: ? Beverages that have caffeine, such as coffee and tea. ? Any products that contain nicotine or tobacco. These products include cigarettes, e-cigarettes, and chewing tobacco. If you need help quitting, ask your health care provider.  Do not use lotions or creams on the same day that you will be having the procedure. What happens during the procedure? For EMG   Your health care provider will ask you to stay in a position so that he or she can access the muscle that will be studied. You may be standing, sitting, or lying down.  You may be given a medicine that numbs the area (local anesthetic).  A very thin needle that has an electrode will be inserted into your muscle.  Another small electrode will be placed on your skin near the muscle.  Your health care provider will ask you to continue to remain still.  The electrodes will send a signal that tells about the electrical activity of your muscles. You may see this on a monitor or hear it in the room.  After your muscles have been studied at rest, your health care provider will ask you to contract or flex your muscles. The electrodes will send a signal that tells about the electrical activity of your muscles.  Your health care provider will remove the electrodes and the electrode needles when the procedure is finished. The procedure may vary among health care providers and hospitals. For NCS   An electrode that records your nerve activity (recording electrode) will be placed on your skin by the muscle that is being studied.  An electrode that is used as a reference (reference electrode) will be placed near the recording electrode.  A paste or gel will be applied to your skin between the recording electrode and the reference electrode.  Your nerve will be  stimulated with a mild shock. Your health care provider will measure how much time it takes for your muscle to react.  Your health care provider will remove the electrodes and the gel when the procedure is finished. The procedure may vary among health care providers and hospitals. What happens after the procedure?  It is up to you to get the results of your procedure. Ask your health care provider, or the department that is doing the procedure, when your results will be ready.  Your health care provider may: ? Give you medicines for any pain. ? Monitor the insertion sites to make sure that bleeding stops. Summary  Electromyoneurogram is a test to check how well your muscles and nerves are working.  If the results of the tests are abnormal, this may indicate disease or injury.  This is a safe procedure. However, problems may occur, such as bleeding and infection.  Your health care provider will do two tests to complete this procedure. One checks your muscles (EMG) and another checks your nerves (NCS).  It is up to you to get the results of your procedure. Ask your health  care provider, or the department that is doing the procedure, when your results will be ready. This information is not intended to replace advice given to you by your health care provider. Make sure you discuss any questions you have with your health care provider. Document Revised: 10/16/2017 Document Reviewed: 09/28/2017 Elsevier Patient Education  2020 [employer]  Gabapentin capsules or tablets What is this medicine? GABAPENTIN (GA ba pen tin) is used to control seizures in certain types of epilepsy. It is also used to treat certain types of nerve pain. This medicine may be used for other purposes; ask your health care provider or pharmacist if you have questions. COMMON BRAND NAME(S): Active-PAC with Gabapentin, Gabarone, Neurontin What should I tell my health care provider before I take this medicine? They  need to know if you have any of these conditions:  history of drug abuse or alcohol abuse problem  kidney disease  lung or breathing disease  suicidal thoughts, plans, or attempt; a previous suicide attempt by you or a family member  an unusual or allergic reaction to gabapentin, other medicines, foods, dyes, or preservatives  pregnant or trying to get pregnant  breast-feeding How should I use this medicine? Take this medicine by mouth with a glass of water. Follow the directions on the prescription label. You can take it with or without food. If it upsets your stomach, take it with food. Take your medicine at regular intervals. Do not take it more often than directed. Do not stop taking except on your doctor's advice. If you are directed to break the 600 or 800 mg tablets in half as part of your dose, the extra half tablet should be used for the next dose. If you have not used the extra half tablet within 28 days, it should be thrown away. A special MedGuide will be given to you by the pharmacist with each prescription and refill. Be sure to read this information carefully each time. Talk to your pediatrician regarding the use of this medicine in children. While this drug may be prescribed for children as young as 3 years for selected conditions, precautions do apply. Overdosage: If you think you have taken too much of this medicine contact a poison control center or emergency room at once. NOTE: This medicine is only for you. Do not share this medicine with others. What if I miss a dose? If you miss a dose, take it as soon as you can. If it is almost time for your next dose, take only that dose. Do not take double or extra doses. What may interact with this medicine? This medicine may interact with the following medications:  alcohol  antihistamines for allergy, cough, and cold  certain medicines for anxiety or sleep  certain medicines for depression like amitriptyline, fluoxetine,  sertraline  certain medicines for seizures like phenobarbital, primidone  certain medicines for stomach problems  general anesthetics like halothane, isoflurane, methoxyflurane, propofol  local anesthetics like lidocaine, pramoxine, tetracaine  medicines that relax muscles for surgery  narcotic medicines for pain  phenothiazines like chlorpromazine, mesoridazine, prochlorperazine, thioridazine This list may not describe all possible interactions. Give your health care provider a list of all the medicines, herbs, non-prescription drugs, or dietary supplements you use. Also tell them if you smoke, drink alcohol, or use illegal drugs. Some items may interact with your medicine. What should I watch for while using this medicine? Visit your doctor or health care provider for regular checks on your progress. You may want  to keep a record at home of how you feel your condition is responding to treatment. You may want to share this information with your doctor or health care provider at each visit. You should contact your doctor or health care provider if your seizures get worse or if you have any new types of seizures. Do not stop taking this medicine or any of your seizure medicines unless instructed by your doctor or health care provider. Stopping your medicine suddenly can increase your seizures or their severity. This medicine may cause serious skin reactions. They can happen weeks to months after starting the medicine. Contact your health care provider right away if you notice fevers or flu-like symptoms with a rash. The rash may be red or purple and then turn into blisters or peeling of the skin. Or, you might notice a red rash with swelling of the face, lips or lymph nodes in your neck or under your arms. Wear a medical identification bracelet or chain if you are taking this medicine for seizures, and carry a card that lists all your medications. You may get drowsy, dizzy, or have blurred vision.  Do not drive, use machinery, or do anything that needs mental alertness until you know how this medicine affects you. To reduce dizzy or fainting spells, do not sit or stand up quickly, especially if you are an older patient. Alcohol can increase drowsiness and dizziness. Avoid alcoholic drinks. Your mouth may get dry. Chewing sugarless gum or sucking hard candy, and drinking plenty of water will help. The use of this medicine may increase the chance of suicidal thoughts or actions. Pay special attention to how you are responding while on this medicine. Any worsening of mood, or thoughts of suicide or dying should be reported to your health care provider right away. Women who become pregnant while using this medicine may enroll in the [place] [employer] Antiepileptic Drug Pregnancy Registry by calling [phone]. This registry collects information about the safety of antiepileptic drug use during pregnancy. What side effects may I notice from receiving this medicine? Side effects that you should report to your doctor or health care professional as soon as possible:  allergic reactions like skin rash, itching or hives, swelling of the face, lips, or tongue  breathing problems  rash, fever, and swollen lymph nodes  redness, blistering, peeling or loosening of the skin, including inside the mouth  suicidal thoughts, mood changes Side effects that usually do not require medical attention (report to your doctor or health care professional if they continue or are bothersome):  dizziness  drowsiness  headache  nausea, vomiting  swelling of ankles, feet, hands  tiredness This list may not describe all possible side effects. Call your doctor for medical advice about side effects. You may report side effects to FDA at 1-800-FDA-1088. Where should I keep my medicine? Keep out of reach of children. This medicine may cause accidental overdose and death if it taken by other adults, children, or  pets. Mix any unused medicine with a substance like cat litter or coffee grounds. Then throw the medicine away in a sealed container like a sealed bag or a coffee can with a lid. Do not use the medicine after the expiration date. Store at room temperature between 15 and 30 degrees C (59 and 86 degrees F). NOTE: This sheet is a summary. It may not cover all possible information. If you have questions about this medicine, talk to your doctor, pharmacist, or health care provider.  2020  Elsevier/Gold Standard (2018-05-03 14:16:43)

## 2019-06-24 NOTE — Progress Notes (Signed)
[id number]    Provider:  Dr Jaynee Eagles Requesting Provider: Josetta Huddle, MD Primary Care Provider:  Josetta Huddle, MD  CC:  Painful feet  HPI:  Eric Sloan is a 59 y.o. male here as requested by Josetta Huddle, MD for painful feet. PMHx HTN, CAD s/p stent, STEMI 09/2017, ED, dysthymia, cigar smoker, exercise-induced asthma, HLD, Obesity, paresthesias of the feet. I reviewed Dr. Inda Merlin notes that did not include any significant history of his neuropathy except to note idiopathic or hereditary neuropathy as a diagnosis.  I was able to find some notes on care everywhere regarding EMG nerve conduction study February 2019 regarding ulnar neuropathy of the left elbow, I reviewed report EMG nerve conduction study data and report showing evidence for chronic left ulnar neuropathy with focal ulnar motor slowing across the wrist as well as a mild left median neuropathy across the wrist; no evidence for left cervical radiculopathy.  His feet started bothering 20 years ago and worsening over the last 5 years to where it keeps him up at night, left foot is worse than his right, his left foot bothers him in the toes and in the arch he feels like his feet are cold and right foot also hurts in his toes, the left his big tow throbs, it is numb, mostly at night keeps him up when laying down he can tell it will keep him up all night, electricity in the left big toe and will go for a few seconds and then come again. For 3 years he used CBD oil. No redness or swelling. The other toes on both feet are just cold. Touching the toes hurts they are numb. Brother has neuropathy as well and started early. Unknown if father had neuropathy but maybe. He takes B12 daily, but unclear if he ever had. He drinks 5 beers a day for 6 years, prior it was on weekends. No balance problems or weakness. No other focal neurologic deficits, associated symptoms, inciting events or modifiable factors.  Reviewed notes, labs and  imaging from outside physicians, which showed:  Requested prior B12 and other labs fro Dr. Inda Merlin, not provided  Review of Systems: Patient complains of symptoms per HPI as well as the following symptoms: numbness. Pertinent negatives and positives per HPI. All others negative.   Social History   Socioeconomic History  . Marital status: Married    Spouse name: Not on file  . Number of children: 2  . Years of education: Not on file  . Highest education level: Not on file  Occupational History  . Not on file  Tobacco Use  . Smoking status: Former Smoker    Types: Cigars    Quit date: 09/22/2017    Years since quitting: 1.7  . Smokeless tobacco: Former Systems developer    Types: Snuff  Substance and Sexual Activity  . Alcohol use: Yes    Alcohol/week: 12.0 standard drinks    Types: 12 Cans of beer per week  . Drug use: Not Currently    Comment: "might have smoked pot 25 years ago"  . Sexual activity: Not on file  Other Topics Concern  . Not on file  Social History Narrative   Lives at home with wife   Caffeine: coffee 2-3 cups/day   Right handed   Social Determinants of Health   Financial Resource Strain:   . Difficulty of Paying Living Expenses:   Food Insecurity:   . Worried About Charity fundraiser in the Last Year:   .  Ran Out of Food in the Last Year:   Transportation Needs:   . Film/video editor (Medical):   Marland Kitchen Lack of Transportation (Non-Medical):   Physical Activity:   . Days of Exercise per Week:   . Minutes of Exercise per Session:   Stress:   . Feeling of Stress :   Social Connections:   . Frequency of Communication with Friends and Family:   . Frequency of Social Gatherings with Friends and Family:   . Attends Religious Services:   . Active Member of Clubs or Organizations:   . Attends Archivist Meetings:   Marland Kitchen Marital Status:   Intimate Partner Violence:   . Fear of Current or Ex-Partner:   . Emotionally Abused:   Marland Kitchen Physically Abused:   .  Sexually Abused:     Family History  Problem Relation Age of Onset  . Heart Problems Father   . Other Brother        "almost certain it's neuropathy"  . Neuropathy Brother     Past Medical History:  Diagnosis Date  . Cigar smoker    persistant cigar use twice a week September 2019- status post acute anterolateral MI in Aug 2019  . Dysthymia   . Erectile dysfunction    situational  . Heart disease   . High cholesterol   . Hypertension   . Insomnia    episodic  . Internal hemorrhoids    rectal fissure, resolved  . Mild intermittent asthma    lately more with exercise  . Mild vitamin D deficiency   . STEMI (ST elevation myocardial infarction) ([place]) 09/2017   acute anterolateral MI; stents to LAD/RCA x 2 Ulyses Southward MD     Patient Active Problem List   Diagnosis Date Noted  . Peripheral polyneuropathy 06/24/2019  . Alcohol abuse 06/24/2019    Past Surgical History:  Procedure Laterality Date  . NO PAST SURGERIES    . stents placed     x3; had a heart attack in 2019    Current Outpatient Medications  Medication Sig Dispense Refill  . amLODipine (NORVASC) 5 MG tablet Take 5 mg by mouth daily.    . ASPIRIN LOW DOSE 81 MG EC tablet Take 81 mg by mouth daily.    Marland Kitchen atorvastatin (LIPITOR) 80 MG tablet Take 80 mg by mouth at bedtime.    . Cholecalciferol (VITAMIN D3 PO) Take by mouth. 1000 units x 2 capsules daily    . clopidogrel (PLAVIX) 75 MG tablet Take 75 mg by mouth. Monday, Wednesday, Friday    . Cyanocobalamin (VITAMIN B-12 PO) Take 1,000 mcg by mouth daily.     Marland Kitchen gemfibrozil (LOPID) 600 MG tablet Take 600 mg by mouth daily.     . metoprolol succinate (TOPROL-XL) 50 MG 24 hr tablet Take 50 mg by mouth daily. Take with or immediately following a meal.    . nitroGLYCERIN (NITROSTAT) 0.4 MG SL tablet Place under the tongue.    . valsartan-hydrochlorothiazide (DIOVAN-HCT) 320-12.5 MG tablet Take 1 tablet by mouth daily.    Marland Kitchen venlafaxine XR (EFFEXOR-XR) 37.5 MG 24  hr capsule Take 37.5 mg by mouth 2 (two) times daily.    Marland Kitchen zolpidem (AMBIEN) 10 MG tablet Take 5 mg by mouth at bedtime as needed.     . gabapentin (NEURONTIN) 300 MG capsule Take 1-2 capsules (300-600 mg total) by mouth at bedtime. 60 capsule 6   No current facility-administered medications for this visit.    Allergies as  of 06/24/2019  . (No Known Allergies)    Vitals: BP (!) 150/97 (BP Location: Right Arm, Patient Position: Sitting)   Pulse 60   Temp (!) 97.2 F (36.2 C) Comment: taken at front  Ht 6' 6"  (1.981 m)   Wt 288 lb (130.6 kg)   BMI 33.28 kg/m  Last Weight:  Wt Readings from Last 1 Encounters:  06/24/19 288 lb (130.6 kg)   Last Height:   Ht Readings from Last 1 Encounters:  06/24/19 6' 6"  (1.981 m)     Physical exam: Exam: Gen: NAD, conversant, well nourised, obese, well groomed                     CV: RRR, no MRG. No Carotid Bruits. No peripheral edema, warm, nontender Eyes: Conjunctivae clear without exudates or hemorrhage  Neuro: Detailed Neurologic Exam  Speech:    Speech is normal; fluent and spontaneous with normal comprehension.  Cognition:    The patient is oriented to person, place, and time;     recent and remote memory intact;     language fluent;     normal attention, concentration,     fund of knowledge Cranial Nerves:    The pupils are equal, round, and reactive to light. Attempted fundoscopy could not visualize due to small pupils. Visual fields are full to finger confrontation. Extraocular movements are intact. Trigeminal sensation is intact and the muscles of mastication are normal. The face is symmetric. The palate elevates in the midline. Hearing intact. Voice is normal. Shoulder shrug is normal. The tongue has normal motion without fasciculations.   Coordination:    No dysmetria  Gait:    Normal native   Motor Observation:    No asymmetry, no atrophy, and no involuntary movements noted. Tone:    Normal muscle tone.     Posture:    Posture is normal. normal erect    Strength:    Strength is V/V in the upper and lower limbs.      Sensation: Decreased pin prick and temp to just above the ankles. Absent proprioception and vibration at great toes.      Reflex Exam:  DTR's:    Deep tendon reflexes in the upper and lower extremities are hyporeflexic bilaterally.   Toes:    The toes are downgoing bilaterally.   Clonus:    Clonus is absent.    Assessment/Plan:  59 y.o. male here as requested by Josetta Huddle, MD for painful feet. PMHx HTN, CAD s/p stent, STEMI 09/2017, ED, dysthymia, cigar smoker, exercise-induced asthma, HLD, Obesity, paresthesias of the feet. He has distal sensory loss in small and large fibers.  He increased his alcohol use about the same time the symptoms int he feet started getting worse, discussed cause of peripheral neuropathy and alcohol use, recommend 1-2 daily maximum. Will perform a thorough evaluation. Also consider genetic testing, will discuss after workup or at emg/ncs.     Orders Placed This Encounter  Procedures  . TSH  . HIV Antibody (routine testing w rflx)  . Sjogren's syndrome antibods(ssa + ssb)  . Pan-ANCA  . B12 and Folate Panel  . RPR  . Hepatitis C antibody  . Rheumatoid factor  . Heavy metals, blood  . Vitamin B6  . Multiple Myeloma Panel (SPEP&IFE w/QIG)  . Hemoglobin A1c  . Methylmalonic acid, serum  . Vitamin B1  . CBC  . Comprehensive metabolic panel  . ANA,IFA RA Diag Pnl w/rflx Tit/Patn  . NCV  with EMG(electromyography)   Meds ordered this encounter  Medications  . gabapentin (NEURONTIN) 300 MG capsule    Sig: Take 1-2 capsules (300-600 mg total) by mouth at bedtime.    Dispense:  60 capsule    Refill:  6    Cc: Josetta Huddle, MD  Sarina Ill, MD  [facility] [address] [place], [place] [address]  Phone [phone] Fax [phone]

## 2019-06-30 LAB — SJOGREN'S SYNDROME ANTIBODS(SSA + SSB)
ENA SSA (RO) Ab: <0.2 AI (ref 0.0–0.9)
ENA SSB (LA) Ab: <0.2 AI (ref 0.0–0.9)

## 2019-06-30 LAB — METHYLMALONIC ACID, SERUM: Methylmalonic Acid: 182 nmol/L (ref 0–378)

## 2019-06-30 LAB — HEAVY METALS, BLOOD
Arsenic: 5 ug/L (ref 2–23)
Lead, Blood: 1 ug/dL (ref 0–4)
Mercury: <1 ug/L (ref 0.0–14.9)

## 2019-06-30 LAB — COMPREHENSIVE METABOLIC PANEL
ALT: 15 IU/L (ref 0–44)
AST: 24 IU/L (ref 0–40)
Albumin/Globulin Ratio: 2.1 (ref 1.2–2.2)
Albumin: 4.6 g/dL (ref 3.8–4.9)
Alkaline Phosphatase: 92 IU/L (ref 39–117)
BUN/Creatinine Ratio: 19 (ref 9–20)
BUN: 19 mg/dL (ref 6–24)
Bilirubin Total: 0.7 mg/dL (ref 0.0–1.2)
CO2: 23 mmol/L (ref 20–29)
Calcium: 9.4 mg/dL (ref 8.7–10.2)
Chloride: 99 mmol/L (ref 96–106)
Creatinine, Ser: 1.01 mg/dL (ref 0.76–1.27)
GFR calc Af Amer: 95 mL/min/{1.73_m2} (ref >59)
GFR calc non Af Amer: 82 mL/min/{1.73_m2} (ref >59)
Globulin, Total: 2.2 g/dL (ref 1.5–4.5)
Glucose: 100 mg/dL — ABNORMAL HIGH (ref 65–99)
Potassium: 4.7 mmol/L (ref 3.5–5.2)
Sodium: 134 mmol/L (ref 134–144)
Total Protein: 6.8 g/dL (ref 6.0–8.5)

## 2019-06-30 LAB — ANA,IFA RA DIAG PNL W/RFLX TIT/PATN
ANA Titer 1: NEGATIVE
Cyclic Citrullin Peptide Ab: 4 units (ref 0–19)
Rhuematoid fact SerPl-aCnc: <10 IU/mL (ref 0.0–13.9)

## 2019-06-30 LAB — MULTIPLE MYELOMA PANEL, SERUM
Albumin SerPl Elph-Mcnc: 4 g/dL (ref 2.9–4.4)
Albumin/Glob SerPl: 1.5 (ref 0.7–1.7)
Alpha 1: 0.2 g/dL (ref 0.0–0.4)
Alpha2 Glob SerPl Elph-Mcnc: 0.7 g/dL (ref 0.4–1.0)
B-Globulin SerPl Elph-Mcnc: 1.1 g/dL (ref 0.7–1.3)
Gamma Glob SerPl Elph-Mcnc: 0.8 g/dL (ref 0.4–1.8)
Globulin, Total: 2.8 g/dL (ref 2.2–3.9)
IgA/Immunoglobulin A, Serum: 69 mg/dL — ABNORMAL LOW (ref 90–386)
IgG (Immunoglobin G), Serum: 735 mg/dL (ref 603–1613)
IgM (Immunoglobulin M), Srm: 84 mg/dL (ref 20–172)

## 2019-06-30 LAB — CBC
Hematocrit: 44.3 % (ref 37.5–51.0)
Hemoglobin: 14.4 g/dL (ref 13.0–17.7)
MCH: 28.6 pg (ref 26.6–33.0)
MCHC: 32.5 g/dL (ref 31.5–35.7)
MCV: 88 fL (ref 79–97)
Platelets: 241 10*3/uL (ref 150–450)
RBC: 5.04 x10E6/uL (ref 4.14–5.80)
RDW: 13.1 % (ref 11.6–15.4)
WBC: 4.9 10*3/uL (ref 3.4–10.8)

## 2019-06-30 LAB — HEMOGLOBIN A1C
Est. average glucose Bld gHb Est-mCnc: 120 mg/dL
Hgb A1c MFr Bld: 5.8 % — ABNORMAL HIGH (ref 4.8–5.6)

## 2019-06-30 LAB — PAN-ANCA
ANCA Proteinase 3: <3.5 U/mL (ref 0.0–3.5)
Atypical pANCA: <1:20 {titer}
C-ANCA: <1:20 {titer}
Myeloperoxidase Ab: <9 U/mL (ref 0.0–9.0)
P-ANCA: <1:20 {titer}

## 2019-06-30 LAB — VITAMIN B6: Vitamin B6: 14.5 ug/L (ref 5.3–46.7)

## 2019-06-30 LAB — VITAMIN B1: Thiamine: 150.5 nmol/L (ref 66.5–200.0)

## 2019-06-30 LAB — HIV ANTIBODY (ROUTINE TESTING W REFLEX): HIV Screen 4th Generation wRfx: NONREACTIVE

## 2019-06-30 LAB — B12 AND FOLATE PANEL
Folate: 8.1 ng/mL (ref >3.0)
Vitamin B-12: 856 pg/mL (ref 232–1245)

## 2019-06-30 LAB — TSH: TSH: 2.7 u[IU]/mL (ref 0.450–4.500)

## 2019-06-30 LAB — HEPATITIS C ANTIBODY: Hep C Virus Ab: <0.1 s/co ratio (ref 0.0–0.9)

## 2019-06-30 LAB — RPR: RPR Ser Ql: NONREACTIVE

## 2019-07-01 ENCOUNTER — Telehealth: Payer: Self-pay | Admitting: *Deleted

## 2019-07-01 NOTE — Telephone Encounter (Signed)
Spoke with the patient and discussed lab results. He verbalized understanding and his questions were answered. He would like the labs sent to his PCP. I let pt know we will only call again if the last pending lab (rheumatoid factor) is abnormal. EMG on 6/10. Pt verbalized appreciation for the call.

## 2019-07-01 NOTE — Telephone Encounter (Signed)
-----   Message from Anson Fret, MD sent at 06/29/2019  9:04 PM EDT ----- Labs so far unremarkable. His HGBA1c was 5.8 which is pre-diabetes and e can discuss with his pcp. Still one lab pending, if it is abnormal we will let him know. thanks

## 2019-07-24 ENCOUNTER — Ambulatory Visit (INDEPENDENT_AMBULATORY_CARE_PROVIDER_SITE_OTHER): Payer: BC Managed Care – PPO | Admitting: Neurology

## 2019-07-24 ENCOUNTER — Other Ambulatory Visit: Payer: Self-pay

## 2019-07-24 DIAGNOSIS — G629 Polyneuropathy, unspecified: Secondary | ICD-10-CM

## 2019-07-24 DIAGNOSIS — Z0289 Encounter for other administrative examinations: Secondary | ICD-10-CM

## 2019-07-24 DIAGNOSIS — F1010 Alcohol abuse, uncomplicated: Secondary | ICD-10-CM

## 2019-07-24 NOTE — Progress Notes (Signed)
History: Patient is here with sensory changes in the feet.  He has a past medical history of alcohol abuse, coronary artery disease status post stent, STEMI, erectile dysfunction, dysthymia, cigar smoker, hyperlipidemia, obesity and paresthesias in the feet.  His feet started bothering him 20 years ago and worsening over the last 5 years which corresponds to an increase in alcohol intake.  He feels paresthesias and numbness and subjective coldness.   I had a discussion about patient's work-up.  I discussed that EMG showed moderately severe axonal polyneuropathy distal, extensive lab work did not show any etiology except prediabetes.  We had a long discussion about this and the causes of neuropathy.  In patient's case, his risk factors include coronary artery disease due to many years of tobacco use (this can also affect the smallest blood vessels which then can damage blood flow to the nerves), he also drinks significantly up to 5 alcoholic beverages in a day and I highly advised cessation but to discuss with. Dr. Kevan Ny prior to doing that to avoid any alcohol withdrawal symptoms, he also does have prediabetes 5.8 which can also be contributing as well as central obesity which we know is an independent risk factor for the development of peripheral neuropathy in the feet.  We discussed all this, we discussed that there is no cure but treatment includes medical management and tight control of his risk factors; he says pain is not that severe.  We also discussed possible genetic testing given his brother has neuropathy but he declined.    I spent 15 minutes of face-to-face and non-face-to-face time with patient on the  1. Peripheral polyneuropathy    diagnosis.  This included previsit chart review, lab review, study review, order entry, electronic health record documentation, patient education on the different diagnostic and therapeutic options, counseling and coordination of care, risks and benefits of  management, compliance, or risk factor reduction. This does not include time spent on emg/ncs.

## 2019-07-28 NOTE — Progress Notes (Signed)
      Full Name: Eric Sloan Gender: Male MRN #: [id number] Date of Birth: 04/24/1960    Visit Date: 07/24/2019 07:48 Age: 59 Years Examining Physician: Takoda Siedlecki, MD  Referring Physician: Robert Gates, MD Height: 6 feet 6 inch  History: Sensory changes in the feet  Summary: The left peroneal motor nerve showed decreased conduction velocity (fibular head to ankle, 35 m/s, normal greater than 44) and decreased conduction velocity (fibular head to ankle, 35 m/s, normal greater than 44). The right peroneal motor nerve showed decreased conduction velocity (fibular head to ankle, 38 m/s, normal greater than 44) and decreased conduction velocity (fibular head to ankle, 38 m/s, normal greater than 44).The left tibial motor nerve showed reduced amplitude (2 mV, normal greater than 4) and decreased conduction velocity (35 m/s, normal greater than 41).  The left sural sensory nerve, right sural sensory nerve, left superficial peroneal sensory nerve, right superficial peroneal sensory nerve showed no response.  The left ulnar sensory nerve showed reduced amplitude (4 V, normal greater than 5).The right tibial motor nerve showed reduced amplitude (1.6 mV, normal greater than 4) and decreased conduction velocity (34 m/s, normal greater than 41).  The left tibial F wave showed prolonged latency (70.2 ms, normal less than 56).  The right tibial F wave showed prolonged latency (88 ms, normal less than 56).  The left and right H reflex showed prolonged latency (46 ms, 46.7 ms, normal less than 35) respectively.  All remaining nerves (as indicated in the following tables) were within normal limits.  All muscles (as indicated in the following tables) were within normal limits.     Conclusion: There is electrophysiologic evidence of a moderately severe, axonal, length dependent, peripheral polyneuropathy.  The significantly prolonged F waves and H reflexes are felt to be due to patient's height of 6 feet 6  inches and not likely due to demyelination.   Seger Jani, M.D.  [facility] [address] [place],  [address] Tel: [phone] Fax: [phone]  Verbal informed consent was obtained from the patient, patient was informed of potential risk of procedure, including bruising, bleeding, hematoma formation, infection, muscle weakness, muscle pain, numbness, among others.         MNC    Nerve / Sites Muscle Latency Ref. Amplitude Ref. Rel Amp Segments Distance Velocity Ref. Area    ms ms mV mV %  cm m/s m/s mVms  L Ulnar - ADM     Wrist ADM 2.6 ?3.3 10.4 ?6.0 100 Wrist - ADM 7   29.2     B.Elbow ADM 7.2  8.9  86 B.Elbow - Wrist 24 53 ?49 28.4     A.Elbow ADM 9.1  8.2  91.6 A.Elbow - B.Elbow 10 52 ?49 27.7         A.Elbow - Wrist      L Peroneal - EDB     Ankle EDB 4.7 ?6.5 2.8 ?2.0 100 Ankle - EDB 9   8.6     Fib head EDB 14.3  2.4  88 Fib head - Ankle 34 35 ?44 8.4     Pop fossa EDB 17.2  2.4  97.9 Pop fossa - Fib head 10 35 ?44 9.0         Pop fossa - Ankle      R Peroneal - EDB     Ankle EDB 5.3 ?6.5 2.2 ?2.0 100 Ankle - EDB 9   6.4     Fib head EDB   14.4  2.2  98 Fib head - Ankle 35 38 ?44 7.2     Pop fossa EDB 17.0  2.1  95.5 Pop fossa - Fib head 10 38 ?44 7.0         Pop fossa - Ankle      L Tibial - AH     Ankle AH 4.3 ?5.8 2.0 ?4.0 100 Ankle - AH 9   9.6     Pop fossa AH 17.6  0.6  30.3 Pop fossa - Ankle 47 35 ?41 2.1  R Tibial - AH     Ankle AH 4.7 ?5.8 1.6 ?4.0 100 Ankle - AH 9   6.6     Pop fossa AH 19.2  0.3  19.4 Pop fossa - Ankle 50 34 ?41 2.7                SNC    Nerve / Sites Rec. Site Peak Lat Ref.  Amp Ref. Segments Distance    ms ms V V  cm  L Radial - Anatomical snuff box (Forearm)     Forearm Wrist 2.1 ?2.9 16 ?15 Forearm - Wrist 10  L Sural - Ankle (Calf)     Calf Ankle NR ?4.4 NR ?6 Calf - Ankle 14  R Sural - Ankle (Calf)     Calf Ankle NR ?4.4 NR ?6 Calf - Ankle 14  L Superficial peroneal - Ankle     Lat leg Ankle NR  ?4.4 NR ?6 Lat leg - Ankle 14  R Superficial peroneal - Ankle     Lat leg Ankle NR ?4.4 NR ?6 Lat leg - Ankle 14  L Ulnar - Orthodromic, (Dig V, Mid palm)     Dig V Wrist 2.5 ?3.1 4 ?5 Dig V - Wrist 96                 F  Wave    Nerve F Lat Ref.   ms ms  L Tibial - AH 70.2 ?56.0  R Tibial - AH 88.0 ?56.0  L Ulnar - ADM 32.0 ?32.0           H Reflex    Nerve H Lat   ms   Left Right Ref.  Tibial - Soleus 46.0 46.7 ?35.0         EMG Summary Table    Spontaneous MUAP Recruitment  Muscle IA Fib PSW Fasc Other Amp Dur. Poly Pattern  L. Deltoid Normal None None None _______ Normal Normal Normal Normal  L. Triceps brachii Normal None None None _______ Normal Normal Normal Normal  L. Pronator teres Normal None None None _______ Normal Normal Normal Normal  L. First dorsal interosseous Normal None None None _______ Normal Normal Normal Normal  L. Flexor digitorum profundus (Ulnar) Normal None None None _______ Normal Normal Normal Normal  L. Opponens pollicis Normal None None None _______ Normal Normal Normal Normal  L. Vastus medialis Normal None None None _______ Normal Normal Normal Normal  L. Tibialis anterior Normal None None None _______ Normal Normal Normal Normal  L. Gastrocnemius (Medial head) Normal None None None _______ Normal Normal Normal Normal  L. Extensor hallucis longus Normal None None None _______ Normal Normal Normal Normal  L. Abductor hallucis Normal None None None _______ Normal Normal Normal Normal

## 2019-07-28 NOTE — Progress Notes (Signed)
See procedure note.

## 2019-07-30 NOTE — Procedures (Signed)
Full Name: Eric Sloan Gender: Male MRN #: [id number] Date of Birth: 03/04/1960    Visit Date: 07/24/2019 07:48 Age: 59 Years Examining Physician: Sarina Ill, MD  Referring Physician: Josetta Huddle, MD Height: 6 feet 6 inch  History: Sensory changes in the feet  Summary: The left peroneal motor nerve showed decreased conduction velocity (fibular head to ankle, 35 m/s, normal greater than 44) and decreased conduction velocity (fibular head to ankle, 35 m/s, normal greater than 44). The right peroneal motor nerve showed decreased conduction velocity (fibular head to ankle, 38 m/s, normal greater than 44) and decreased conduction velocity (fibular head to ankle, 38 m/s, normal greater than 44).The left tibial motor nerve showed reduced amplitude (2 mV, normal greater than 4) and decreased conduction velocity (35 m/s, normal greater than 41).  The left sural sensory nerve, right sural sensory nerve, left superficial peroneal sensory nerve, right superficial peroneal sensory nerve showed no response.  The left ulnar sensory nerve showed reduced amplitude (4 V, normal greater than 5).The right tibial motor nerve showed reduced amplitude (1.6 mV, normal greater than 4) and decreased conduction velocity (34 m/s, normal greater than 41).  The left tibial F wave showed prolonged latency (70.2 ms, normal less than 56).  The right tibial F wave showed prolonged latency (88 ms, normal less than 56).  The left and right H reflex showed prolonged latency (46 ms, 46.7 ms, normal less than 35) respectively.  All remaining nerves (as indicated in the following tables) were within normal limits.  All muscles (as indicated in the following tables) were within normal limits.     Conclusion: There is electrophysiologic evidence of a moderately severe, axonal, length dependent, peripheral polyneuropathy.  The significantly prolonged F waves and H reflexes are felt to be due to patient's height of 6 feet 6  inches and not likely due to demyelination.   Sarina Ill, M.D.  [facility] [place], [place] [address] Tel: [phone] Fax: [phone]  Verbal informed consent was obtained from the patient, patient was informed of potential risk of procedure, including bruising, bleeding, hematoma formation, infection, muscle weakness, muscle pain, numbness, among others.         [place]    Nerve / Sites Muscle Latency Ref. Amplitude Ref. Rel Amp Segments Distance Velocity Ref. Area    ms ms mV mV %  cm m/s m/s mVms  L Ulnar - ADM     Wrist ADM 2.6 ?3.3 10.4 ?6.0 100 Wrist - ADM 7   29.2     B.Elbow ADM 7.2  8.9  86 B.Elbow - Wrist 24 53 ?49 28.4     A.Elbow ADM 9.1  8.2  91.6 A.Elbow - B.Elbow 10 52 ?49 27.7         A.Elbow - Wrist      L Peroneal - EDB     Ankle EDB 4.7 ?6.5 2.8 ?2.0 100 Ankle - EDB 9   8.6     Fib head EDB 14.3  2.4  88 Fib head - Ankle 34 35 ?44 8.4     Pop fossa EDB 17.2  2.4  97.9 Pop fossa - Fib head 10 35 ?44 9.0         Pop fossa - Ankle      R Peroneal - EDB     Ankle EDB 5.3 ?6.5 2.2 ?2.0 100 Ankle - EDB 9   6.4     Fib head EDB  14.4  2.2  98 Fib head - Ankle 35 38 ?44 7.2     Pop fossa EDB 17.0  2.1  95.5 Pop fossa - Fib head 10 38 ?44 7.0         Pop fossa - Ankle      L Tibial - AH     Ankle AH 4.3 ?5.8 2.0 ?4.0 100 Ankle - AH 9   9.6     Pop fossa AH 17.6  0.6  30.3 Pop fossa - Ankle 47 35 ?41 2.1  R Tibial - AH     Ankle AH 4.7 ?5.8 1.6 ?4.0 100 Ankle - AH 9   6.6     Pop fossa AH 19.2  0.3  19.4 Pop fossa - Ankle 50 34 ?41 2.7                SNC    Nerve / Sites Rec. Site Peak Lat Ref.  Amp Ref. Segments Distance    ms ms V V  cm  L Radial - Anatomical snuff box (Forearm)     Forearm Wrist 2.1 ?2.9 16 ?15 Forearm - Wrist 10  L Sural - Ankle (Calf)     Calf Ankle NR ?4.4 NR ?6 Calf - Ankle 14  R Sural - Ankle (Calf)     Calf Ankle NR ?4.4 NR ?6 Calf - Ankle 14  L Superficial peroneal - Ankle     Lat leg Ankle NR  ?4.4 NR ?6 Lat leg - Ankle 14  R Superficial peroneal - Ankle     Lat leg Ankle NR ?4.4 NR ?6 Lat leg - Ankle 14  L Ulnar - Orthodromic, (Dig V, Mid palm)     Dig V Wrist 2.5 ?3.1 4 ?5 Dig V - Wrist 96                 F  Wave    Nerve F Lat Ref.   ms ms  L Tibial - AH 70.2 ?56.0  R Tibial - AH 88.0 ?56.0  L Ulnar - ADM 32.0 ?32.0           H Reflex    Nerve H Lat   ms   Left Right Ref.  Tibial - Soleus 46.0 46.7 ?35.0         EMG Summary Table    Spontaneous MUAP Recruitment  Muscle IA Fib PSW Fasc Other Amp Dur. Poly Pattern  L. Deltoid Normal None None None _______ Normal Normal Normal Normal  L. Triceps brachii Normal None None None _______ Normal Normal Normal Normal  L. Pronator teres Normal None None None _______ Normal Normal Normal Normal  L. First dorsal interosseous Normal None None None _______ Normal Normal Normal Normal  L. Flexor digitorum profundus (Ulnar) Normal None None None _______ Normal Normal Normal Normal  L. Opponens pollicis Normal None None None _______ Normal Normal Normal Normal  L. Vastus medialis Normal None None None _______ Normal Normal Normal Normal  L. Tibialis anterior Normal None None None _______ Normal Normal Normal Normal  L. Gastrocnemius (Medial head) Normal None None None _______ Normal Normal Normal Normal  L. Extensor hallucis longus Normal None None None _______ Normal Normal Normal Normal  L. Abductor hallucis Normal None None None _______ Normal Normal Normal Normal

## 2019-10-23 DIAGNOSIS — E785 Hyperlipidemia, unspecified: Secondary | ICD-10-CM | POA: Diagnosis not present

## 2019-10-23 DIAGNOSIS — I2510 Atherosclerotic heart disease of native coronary artery without angina pectoris: Secondary | ICD-10-CM | POA: Diagnosis not present

## 2019-10-23 DIAGNOSIS — I252 Old myocardial infarction: Secondary | ICD-10-CM | POA: Diagnosis not present

## 2019-10-23 DIAGNOSIS — I10 Essential (primary) hypertension: Secondary | ICD-10-CM | POA: Diagnosis not present

## 2020-01-13 DIAGNOSIS — L570 Actinic keratosis: Secondary | ICD-10-CM | POA: Diagnosis not present

## 2020-01-13 DIAGNOSIS — L814 Other melanin hyperpigmentation: Secondary | ICD-10-CM | POA: Diagnosis not present

## 2020-02-19 DIAGNOSIS — G609 Hereditary and idiopathic neuropathy, unspecified: Secondary | ICD-10-CM | POA: Diagnosis not present

## 2020-02-19 DIAGNOSIS — I2510 Atherosclerotic heart disease of native coronary artery without angina pectoris: Secondary | ICD-10-CM | POA: Diagnosis not present

## 2020-02-19 DIAGNOSIS — E559 Vitamin D deficiency, unspecified: Secondary | ICD-10-CM | POA: Diagnosis not present

## 2020-02-19 DIAGNOSIS — Z0000 Encounter for general adult medical examination without abnormal findings: Secondary | ICD-10-CM | POA: Diagnosis not present

## 2020-02-19 DIAGNOSIS — E7800 Pure hypercholesterolemia, unspecified: Secondary | ICD-10-CM | POA: Diagnosis not present

## 2020-02-19 DIAGNOSIS — Z125 Encounter for screening for malignant neoplasm of prostate: Secondary | ICD-10-CM | POA: Diagnosis not present

## 2020-02-19 DIAGNOSIS — E669 Obesity, unspecified: Secondary | ICD-10-CM | POA: Diagnosis not present

## 2020-02-19 DIAGNOSIS — I10 Essential (primary) hypertension: Secondary | ICD-10-CM | POA: Diagnosis not present

## 2020-03-19 DIAGNOSIS — I781 Nevus, non-neoplastic: Secondary | ICD-10-CM | POA: Diagnosis not present

## 2020-03-19 DIAGNOSIS — L814 Other melanin hyperpigmentation: Secondary | ICD-10-CM | POA: Diagnosis not present

## 2020-03-19 DIAGNOSIS — L570 Actinic keratosis: Secondary | ICD-10-CM | POA: Diagnosis not present

## 2020-03-19 DIAGNOSIS — D225 Melanocytic nevi of trunk: Secondary | ICD-10-CM | POA: Diagnosis not present

## 2020-03-19 DIAGNOSIS — L821 Other seborrheic keratosis: Secondary | ICD-10-CM | POA: Diagnosis not present

## 2020-08-26 DIAGNOSIS — R194 Change in bowel habit: Secondary | ICD-10-CM | POA: Diagnosis not present

## 2020-08-26 DIAGNOSIS — K591 Functional diarrhea: Secondary | ICD-10-CM | POA: Diagnosis not present

## 2020-08-26 DIAGNOSIS — Z1211 Encounter for screening for malignant neoplasm of colon: Secondary | ICD-10-CM | POA: Diagnosis not present

## 2020-09-02 DIAGNOSIS — K6389 Other specified diseases of intestine: Secondary | ICD-10-CM | POA: Diagnosis not present

## 2020-09-02 DIAGNOSIS — R197 Diarrhea, unspecified: Secondary | ICD-10-CM | POA: Diagnosis not present

## 2021-03-10 DIAGNOSIS — Z125 Encounter for screening for malignant neoplasm of prostate: Secondary | ICD-10-CM | POA: Diagnosis not present

## 2021-03-10 DIAGNOSIS — I10 Essential (primary) hypertension: Secondary | ICD-10-CM | POA: Diagnosis not present

## 2021-03-10 DIAGNOSIS — Z0000 Encounter for general adult medical examination without abnormal findings: Secondary | ICD-10-CM | POA: Diagnosis not present

## 2021-03-10 DIAGNOSIS — E7800 Pure hypercholesterolemia, unspecified: Secondary | ICD-10-CM | POA: Diagnosis not present

## 2021-03-10 DIAGNOSIS — G4700 Insomnia, unspecified: Secondary | ICD-10-CM | POA: Diagnosis not present

## 2021-03-10 DIAGNOSIS — E669 Obesity, unspecified: Secondary | ICD-10-CM | POA: Diagnosis not present

## 2021-06-08 DIAGNOSIS — E669 Obesity, unspecified: Secondary | ICD-10-CM | POA: Diagnosis not present

## 2021-06-08 DIAGNOSIS — F341 Dysthymic disorder: Secondary | ICD-10-CM | POA: Diagnosis not present

## 2021-06-08 DIAGNOSIS — I10 Essential (primary) hypertension: Secondary | ICD-10-CM | POA: Diagnosis not present

## 2021-06-08 DIAGNOSIS — I2510 Atherosclerotic heart disease of native coronary artery without angina pectoris: Secondary | ICD-10-CM | POA: Diagnosis not present

## 2021-06-30 DIAGNOSIS — I10 Essential (primary) hypertension: Secondary | ICD-10-CM | POA: Diagnosis not present

## 2021-06-30 DIAGNOSIS — I252 Old myocardial infarction: Secondary | ICD-10-CM | POA: Diagnosis not present

## 2021-06-30 DIAGNOSIS — Z955 Presence of coronary angioplasty implant and graft: Secondary | ICD-10-CM | POA: Diagnosis not present

## 2021-06-30 DIAGNOSIS — I2510 Atherosclerotic heart disease of native coronary artery without angina pectoris: Secondary | ICD-10-CM | POA: Diagnosis not present

## 2021-10-27 DIAGNOSIS — E785 Hyperlipidemia, unspecified: Secondary | ICD-10-CM | POA: Diagnosis not present

## 2021-10-27 DIAGNOSIS — Z23 Encounter for immunization: Secondary | ICD-10-CM | POA: Diagnosis not present

## 2021-10-27 DIAGNOSIS — I10 Essential (primary) hypertension: Secondary | ICD-10-CM | POA: Diagnosis not present

## 2021-10-27 DIAGNOSIS — I252 Old myocardial infarction: Secondary | ICD-10-CM | POA: Diagnosis not present

## 2021-12-29 DIAGNOSIS — G629 Polyneuropathy, unspecified: Secondary | ICD-10-CM | POA: Diagnosis not present

## 2021-12-29 DIAGNOSIS — L602 Onychogryphosis: Secondary | ICD-10-CM | POA: Diagnosis not present

## 2021-12-29 DIAGNOSIS — I10 Essential (primary) hypertension: Secondary | ICD-10-CM | POA: Diagnosis not present

## 2021-12-29 DIAGNOSIS — B353 Tinea pedis: Secondary | ICD-10-CM | POA: Diagnosis not present

## 2021-12-29 DIAGNOSIS — S91202A Unspecified open wound of left great toe with damage to nail, initial encounter: Secondary | ICD-10-CM | POA: Diagnosis not present
